# Patient Record
Sex: Female | Born: 1935
Health system: Southern US, Community
[De-identification: ages and names within clinical notes are randomized; demographics above are authoritative.]

## PROBLEM LIST (undated history)

## (undated) DIAGNOSIS — K219 Gastro-esophageal reflux disease without esophagitis: Secondary | ICD-10-CM

## (undated) DIAGNOSIS — M1990 Unspecified osteoarthritis, unspecified site: Secondary | ICD-10-CM

## (undated) DIAGNOSIS — E538 Deficiency of other specified B group vitamins: Secondary | ICD-10-CM

## (undated) DIAGNOSIS — R7303 Prediabetes: Secondary | ICD-10-CM

## (undated) DIAGNOSIS — G459 Transient cerebral ischemic attack, unspecified: Secondary | ICD-10-CM

## (undated) DIAGNOSIS — C801 Malignant (primary) neoplasm, unspecified: Secondary | ICD-10-CM

## (undated) DIAGNOSIS — K5790 Diverticulosis of intestine, part unspecified, without perforation or abscess without bleeding: Secondary | ICD-10-CM

## (undated) DIAGNOSIS — E782 Mixed hyperlipidemia: Secondary | ICD-10-CM

## (undated) DIAGNOSIS — E119 Type 2 diabetes mellitus without complications: Secondary | ICD-10-CM

## (undated) HISTORY — PX: VULVECTOMY: SHX1086

## (undated) HISTORY — DX: Deficiency of other specified B group vitamins: E53.8

## (undated) HISTORY — DX: Mixed hyperlipidemia: E78.2

## (undated) HISTORY — PX: ABDOMINAL HYSTERECTOMY: SHX81

## (undated) HISTORY — DX: Prediabetes: R73.03

## (undated) HISTORY — DX: Transient cerebral ischemic attack, unspecified: G45.9

---

## 2014-10-06 ENCOUNTER — Emergency Department (HOSPITAL_COMMUNITY)
Admission: EM | Admit: 2014-10-06 | Discharge: 2014-10-06 | Disposition: A | Discharge status: Home / Self Care | Payer: Medicare Other | Attending: Emergency Medicine | Admitting: Emergency Medicine

## 2014-10-06 ENCOUNTER — Encounter (HOSPITAL_COMMUNITY): Payer: Self-pay | Admitting: Emergency Medicine

## 2014-10-06 DIAGNOSIS — Z880 Allergy status to penicillin: Secondary | ICD-10-CM | POA: Diagnosis not present

## 2014-10-06 DIAGNOSIS — H6592 Unspecified nonsuppurative otitis media, left ear: Secondary | ICD-10-CM | POA: Diagnosis not present

## 2014-10-06 DIAGNOSIS — H9202 Otalgia, left ear: Secondary | ICD-10-CM | POA: Diagnosis present

## 2014-10-06 HISTORY — DX: Unspecified osteoarthritis, unspecified site: M19.90

## 2014-10-06 MED ORDER — AZITHROMYCIN 250 MG PO TABS: 250.0000 mg | ORAL_TABLET | Freq: Every day | ORAL | Status: DC

## 2014-10-06 NOTE — Discharge Instructions (Signed)
Draining Ear Ear wax, pus, blood and other fluids are examples of the different types of drainage from ears. Drops or cream may be needed to lessen the itching which may occur with ear drainage. CAUSES   Skin irritations in the ear.  Ear infection.  Swimmer's ear.  Ruptured eardrum.  Foreign object in the ear canal.  Sudden pressure changes.  Head injury. HOME CARE INSTRUCTIONS   Only take over-the-counter or prescription medicines for pain, fever, or discomfort as directed by your caregiver.  Do not rub the ear canal with cotton-tipped swabs.  Do not swim until your caregiver says it is okay.  Before you take a shower, cover a cotton ball with petroleum jelly to keep water out.  Limit exposure to smoke. Secondhand smoke can increase the chance for ear infections.  Keep up with immunizations.  Wash your hands well.  Keep all follow-up appointments to examine the ear and evaluate hearing. SEEK MEDICAL CARE IF:   You have increased drainage.  You have ear pain, a fever, or drainage that is not getting better after 48 hours of antibiotics.  You are unusually tired. SEEK IMMEDIATE MEDICAL CARE IF:  You have severe ear pain or headache.  The patient is older than 3 months with a rectal or oral temperature of 102 F (38.9 C) or higher.  The patient is 39 months old or younger with a rectal temperature of 100.4 F (38 C) or higher.  You vomit.  You feel dizzy.  You have a seizure.  You have new hearing loss. MAKE SURE YOU:   Understand these instructions.  Will watch your condition.  Will get help right away if you are not doing well or get worse. Document Released: 12/11/2005 Document Revised: 03/04/2012 Document Reviewed: 10/14/2009 [facility] Patient Information 2015 [place], [place]. This information is not intended to replace advice given to you by your health care provider. Make sure you discuss any questions you have with your health care  provider.  Otitis Media Otitis media is redness, soreness, and inflammation of the middle ear. Otitis media may be caused by allergies or, most commonly, by infection. Often it occurs as a complication of the common cold. SIGNS AND SYMPTOMS Symptoms of otitis media may include:  Earache.  Fever.  Ringing in your ear.  Headache.  Leakage of fluid from the ear. DIAGNOSIS To diagnose otitis media, your health care provider will examine your ear with an otoscope. This is an instrument that allows your health care provider to see into your ear in order to examine your eardrum. Your health care provider also will ask you questions about your symptoms. TREATMENT  Typically, otitis media resolves on its own within 3-5 days. Your health care provider may prescribe medicine to ease your symptoms of pain. If otitis media does not resolve within 5 days or is recurrent, your health care provider may prescribe antibiotic medicines if he or she suspects that a bacterial infection is the cause. HOME CARE INSTRUCTIONS   If you were prescribed an antibiotic medicine, finish it all even if you start to feel better.  Take medicines only as directed by your health care provider.  Keep all follow-up visits as directed by your health care provider. SEEK MEDICAL CARE IF:  You have otitis media only in one ear, or bleeding from your nose, or both.  You notice a lump on your neck.  You are not getting better in 3-5 days.  You feel worse instead of better. SEEK IMMEDIATE  MEDICAL CARE IF:   You have pain that is not controlled with medicine.  You have swelling, redness, or pain around your ear or stiffness in your neck.  You notice that part of your face is paralyzed.  You notice that the bone behind your ear (mastoid) is tender when you touch it. MAKE SURE YOU:   Understand these instructions.  Will watch your condition.  Will get help right away if you are not doing well or get  worse. Document Released: 09/15/2004 Document Revised: 04/27/2014 Document Reviewed: 07/08/2013 [facility] Patient Information 2015 [place], [place]. This information is not intended to replace advice given to you by your health care provider. Make sure you discuss any questions you have with your health care provider.

## 2014-10-06 NOTE — ED Notes (Signed)
MD at bedside. 

## 2014-10-06 NOTE — ED Provider Notes (Signed)
This chart was scribed for Lemay, DO by Edison Simon, ED Scribe. This patient was seen in room APA04/APA04.  TIME SEEN: [phone]  CHIEF COMPLAINT: left ear pain  HPI: Rhemi Balbach is a 78 y.o. female with no significant past medical history who presents to the Emergency Department complaining of left ear pain with onset 1 month ago. She reports she had pain in her right ear that is mostly resolved at this time. She reports associated clear drainage. She describes her pain as soreness. She reports associated tinnitus to her right ear. No hearing loss She states she has not seen her PCP. She states she has been trying to clean her ears with Q-tips. She denies fever, cough, vomiting or diarrhea, headache. No vertigo.  ROS: See HPI Constitutional: no fever  Eyes: no drainage  ENT: no runny nose, ear pain, ear drainage, tinnitus Cardiovascular:  no chest pain  Resp: no SOB  GI: no vomiting GU: no dysuria Integumentary: no rash  Allergy: no hives  Musculoskeletal: no leg swelling  Neurological: no slurred speech ROS otherwise negative  PAST MEDICAL HISTORY/PAST SURGICAL HISTORY:  No past medical history on file.  MEDICATIONS:  Prior to Admission medications   Not on File    ALLERGIES:  Allergies not on file  SOCIAL HISTORY:  History  Substance Use Topics  . Smoking status: Not on file  . Smokeless tobacco: Not on file  . Alcohol Use: Not on file    FAMILY HISTORY: No family history on file.  EXAM: BP 156/98  Temp(Src) 98.1 F (36.7 C) (Oral)  Resp 18  Ht 5\' 8"  (1.727 m)  Wt 200 lb (90.719 kg)  BMI 30.42 kg/m2  SpO2 97% CONSTITUTIONAL: Alert and oriented and responds appropriately to questions. Well-appearing; well-nourished HEAD: Normocephalic EYES: Conjunctivae clear, PERRL ENT: Normal nose; no rhinorrhea; moist mucous membranes; pharynx without lesions noted; left TM erythematous and bulging with some clear fluid in the external auditory canal, no perforation  of the TM, no purulence, no tenderness or erhythema over mastoid process; small amount of clear drainage to posterior oropharynx with associated cobblestoning; no tonsillar hypertrophy or exudate, no muffled voice, no uvular deviation NECK: Supple, no meningismus, no LAD  CARD: RRR; S1 and S2 appreciated; no murmurs, no clicks, no rubs, no gallops RESP: Normal chest excursion without splinting or tachypnea; breath sounds clear and equal bilaterally; no wheezes, no rhonchi, no rales,  ABD/GI: Normal bowel sounds; non-distended; soft, non-tender, no rebound, no guarding BACK:  The back appears normal and is non-tender to palpation, there is no CVA tenderness EXT: Normal ROM in all joints; non-tender to palpation; no edema; normal capillary refill; no cyanosis    SKIN: Normal color for age and race; warm NEURO: Moves all extremities equally PSYCH: The patient's mood and manner are appropriate. Grooming and personal hygiene are appropriate.  MEDICAL DECISION MAKING: Patient here with otitis media. We'll discharge home with prescription for azithromycin as she is allergic to penicillins. Have discussed with her not to put anything into her external auditory canal. Have start to use over-the-counter drops for ear drainage if this continues. We'll have her use over-the-counter antihistamines. Have advised her to followup with her primary care physician if symptoms continue. She is otherwise well-appearing, nontoxic and in no distress. Patient given return precautions. She verbalizes understanding and is comfortable with plan.  I personally performed the services described in this documentation, which was scribed in my presence. The recorded information has been reviewed and is  accurate.    [place], DO 10/06/14 1642

## 2014-10-06 NOTE — ED Notes (Signed)
Having drainage from left ear and small amount from right.

## 2015-04-01 ENCOUNTER — Encounter: Payer: Self-pay | Admitting: Radiation Oncology

## 2015-04-05 ENCOUNTER — Encounter (HOSPITAL_COMMUNITY): Payer: Self-pay | Admitting: Emergency Medicine

## 2015-04-05 ENCOUNTER — Emergency Department (HOSPITAL_COMMUNITY): Payer: Medicare Other

## 2015-04-05 ENCOUNTER — Emergency Department (HOSPITAL_COMMUNITY)
Admission: EM | Admit: 2015-04-05 | Discharge: 2015-04-05 | Disposition: A | Discharge status: Home / Self Care | Payer: Medicare Other | Attending: Emergency Medicine | Admitting: Emergency Medicine

## 2015-04-05 DIAGNOSIS — Z79899 Other long term (current) drug therapy: Secondary | ICD-10-CM | POA: Insufficient documentation

## 2015-04-05 DIAGNOSIS — M542 Cervicalgia: Secondary | ICD-10-CM | POA: Insufficient documentation

## 2015-04-05 DIAGNOSIS — Z7982 Long term (current) use of aspirin: Secondary | ICD-10-CM | POA: Insufficient documentation

## 2015-04-05 DIAGNOSIS — R002 Palpitations: Secondary | ICD-10-CM | POA: Insufficient documentation

## 2015-04-05 DIAGNOSIS — M79602 Pain in left arm: Secondary | ICD-10-CM | POA: Diagnosis present

## 2015-04-05 DIAGNOSIS — M1812 Unilateral primary osteoarthritis of first carpometacarpal joint, left hand: Secondary | ICD-10-CM | POA: Diagnosis not present

## 2015-04-05 DIAGNOSIS — H6691 Otitis media, unspecified, right ear: Secondary | ICD-10-CM

## 2015-04-05 DIAGNOSIS — Z792 Long term (current) use of antibiotics: Secondary | ICD-10-CM | POA: Diagnosis not present

## 2015-04-05 DIAGNOSIS — H6123 Impacted cerumen, bilateral: Secondary | ICD-10-CM | POA: Insufficient documentation

## 2015-04-05 DIAGNOSIS — Z880 Allergy status to penicillin: Secondary | ICD-10-CM | POA: Insufficient documentation

## 2015-04-05 LAB — CBC WITH DIFFERENTIAL/PLATELET
BASOS ABS: 0.1 10*3/uL (ref 0.0–0.1)
BASOS PCT: 1 % (ref 0–1)
EOS PCT: 5 % (ref 0–5)
Eosinophils Absolute: 0.4 10*3/uL (ref 0.0–0.7)
HCT: 43.5 % (ref 36.0–46.0)
Hemoglobin: 14.3 g/dL (ref 12.0–15.0)
LYMPHS PCT: 25 % (ref 12–46)
Lymphs Abs: 2.2 10*3/uL (ref 0.7–4.0)
MCH: 31 pg (ref 26.0–34.0)
MCHC: 32.9 g/dL (ref 30.0–36.0)
MCV: 94.2 fL (ref 78.0–100.0)
Monocytes Absolute: 1 10*3/uL (ref 0.1–1.0)
Monocytes Relative: 11 % (ref 3–12)
Neutro Abs: 4.9 10*3/uL (ref 1.7–7.7)
Neutrophils Relative %: 57 % (ref 43–77)
PLATELETS: 327 10*3/uL (ref 150–400)
RBC: 4.62 MIL/uL (ref 3.87–5.11)
RDW: 12.8 % (ref 11.5–15.5)
WBC: 8.5 10*3/uL (ref 4.0–10.5)

## 2015-04-05 LAB — COMPREHENSIVE METABOLIC PANEL
ALK PHOS: 70 U/L (ref 39–117)
ALT: 13 U/L (ref 0–35)
AST: 19 U/L (ref 0–37)
Albumin: 3.8 g/dL (ref 3.5–5.2)
Anion gap: 9 (ref 5–15)
BUN: 13 mg/dL (ref 6–23)
CO2: 26 mmol/L (ref 19–32)
Calcium: 8.9 mg/dL (ref 8.4–10.5)
Chloride: 104 mmol/L (ref 96–112)
Creatinine, Ser: 0.9 mg/dL (ref 0.50–1.10)
GFR calc Af Amer: 70 mL/min — ABNORMAL LOW (ref >90)
GFR calc non Af Amer: 61 mL/min — ABNORMAL LOW (ref >90)
GLUCOSE: 127 mg/dL — AB (ref 70–99)
POTASSIUM: 4 mmol/L (ref 3.5–5.1)
SODIUM: 139 mmol/L (ref 135–145)
TOTAL PROTEIN: 7.6 g/dL (ref 6.0–8.3)
Total Bilirubin: 0.4 mg/dL (ref 0.3–1.2)

## 2015-04-05 LAB — TROPONIN I

## 2015-04-05 MED ORDER — DOCUSATE SODIUM 50 MG/5ML PO LIQD
10.0000 mg | Freq: Once | ORAL | Status: DC
  Filled 2015-04-05: qty 10

## 2015-04-05 MED ORDER — DOCUSATE SODIUM 50 MG/5ML PO LIQD
10.0000 mg | Freq: Once | ORAL | Status: AC
  Administered 2015-04-05: 10 mg via OTIC
  Filled 2015-04-05: qty 10

## 2015-04-05 MED ORDER — DOCUSATE SODIUM 50 MG/5ML PO LIQD
ORAL | Status: AC
  Filled 2015-04-05: qty 10

## 2015-04-05 MED ORDER — CEFDINIR 300 MG PO CAPS: 300.0000 mg | ORAL_CAPSULE | Freq: Two times a day (BID) | ORAL | Status: DC

## 2015-04-05 NOTE — ED Provider Notes (Signed)
CSN: [id number]     Arrival date & time 04/05/15  1908 History   First MD Initiated Contact with Patient 04/05/15 2047     Chief Complaint  Patient presents with  . Arm Pain     (Consider location/radiation/quality/duration/timing/severity/associated sxs/prior Treatment) Patient is a 79 y.o. female presenting with arm pain.  Arm Pain Associated symptoms include headaches. Pertinent negatives include no chest pain, no abdominal pain and no shortness of breath.   patient with various complaints. Complaining of pain in her left hand. States his been there for a while but worse now. No trauma. States it is more swollen. Also complaining her left shoulder going down the arm. Worse with movement. States that she also has had some feeling of heat in her chest when hand pain was bothering her. States she did feel her heart race a little bit. Just feels better now. Also states his in both her ears feel full. States she's having a little difficulty hearing. Complaining of a bump on the back of her head. No trauma. No fevers. No chills. No cough. No nausea vomiting.   Past Medical History  Diagnosis Date  . Arthritis    Past Surgical History  Procedure Laterality Date  . Abdominal hysterectomy     Family History  Problem Relation Age of Onset  . Heart failure Father   . Cancer Sister   . Cancer Other    History  Substance Use Topics  . Smoking status: Never Smoker   . Smokeless tobacco: Not on file  . Alcohol Use: No   OB History    No data available     Review of Systems  Constitutional: Negative for activity change and appetite change.  HENT: Positive for ear pain. Negative for ear discharge.   Eyes: Negative for pain.  Respiratory: Negative for chest tightness and shortness of breath.   Cardiovascular: Positive for palpitations. Negative for chest pain and leg swelling.  Gastrointestinal: Negative for nausea, vomiting, abdominal pain and diarrhea.  Genitourinary: Negative for  flank pain.  Musculoskeletal: Positive for neck pain. Negative for back pain and neck stiffness.  Skin: Negative for rash.  Neurological: Positive for headaches. Negative for weakness and numbness.  Psychiatric/Behavioral: Negative for behavioral problems.      Allergies  Lidocaine and Penicillins  Home Medications   Prior to Admission medications   Medication Sig Start Date End Date Taking? Authorizing Provider  acetaminophen (TYLENOL) 500 MG tablet Take 1,000 mg by mouth every 6 (six) hours as needed for mild pain.   Yes Historical Provider, MD  aspirin EC 81 MG tablet Take 81 mg by mouth daily.   Yes Historical Provider, MD  cetirizine (ZYRTEC) 10 MG tablet Take 10 mg by mouth every morning.   Yes Historical Provider, MD  diphenhydrAMINE (BENADRYL) 25 MG tablet 25 mg at bedtime.    Yes Historical Provider, MD  ibuprofen (ADVIL,MOTRIN) 200 MG tablet Take 200 mg by mouth every 6 (six) hours as needed for mild pain or moderate pain.   Yes Historical Provider, MD  Multiple Vitamin (MULTIVITAMIN WITH MINERALS) TABS tablet Take 1 tablet by mouth daily.   Yes Historical Provider, MD  azithromycin (ZITHROMAX) 250 MG tablet Take 1 tablet (250 mg total) by mouth daily. Take first 2 tablets together, then 1 every day until finished. Patient not taking: Reported on 04/05/2015 10/06/14   Delice Bison Ward, DO  cefdinir (OMNICEF) 300 MG capsule Take 1 capsule (300 mg total) by mouth 2 (two) times daily.  04/05/15   Davonna Belling, MD   BP 140/90 mmHg  Pulse 83  Temp(Src) 98.3 F (36.8 C) (Oral)  Resp 20  Ht 5\' 8"  (1.727 m)  Wt 200 lb (90.719 kg)  BMI 30.42 kg/m2  SpO2 96% Physical Exam  Constitutional: She appears well-developed and well-nourished.  HENT:  Head: Normocephalic and atraumatic.  Some cerumen in bilateral ears.  Eyes: Pupils are equal, round, and reactive to light.  Neck: Neck supple.  Cardiovascular: Normal rate and regular rhythm.   Pulmonary/Chest: Effort normal and breath  sounds normal.  Abdominal: Soft. Bowel sounds are normal. There is no tenderness.  Musculoskeletal: Normal range of motion.  Neurological: She is alert.  Skin: Skin is warm.    ED Course  Procedures (including critical care time) Labs Review Labs Reviewed  COMPREHENSIVE METABOLIC PANEL - Abnormal; Notable for the following:    Glucose, Bld 127 (*)    GFR calc non Af Amer 61 (*)    GFR calc Af Amer 70 (*)    All other components within normal limits  TROPONIN I  CBC WITH DIFFERENTIAL/PLATELET    Imaging Review Dg Chest 2 View  04/05/2015   CLINICAL DATA:  Left arm pain starting today. Neck pain. Stent neck. Hypertension. Sensation of heat in the left chest.  EXAM: CHEST  2 VIEW  COMPARISON:  None.  FINDINGS: Mildly tortuous thoracic aorta. Mild enlargement of the cardiopericardial silhouette.  No pleural effusion.  The lungs appear clear.  IMPRESSION: 1. Mild enlargement of the cardiopericardial silhouette, but the lungs appear clear, without evidence of edema.   Electronically Signed   By: Van Clines M.D.   On: 04/05/2015 20:47   Dg Finger Thumb Left  04/05/2015   CLINICAL DATA:  Swelling of the left thumb.  EXAM: LEFT THUMB 2+V  COMPARISON:  None.  FINDINGS: Degenerative loss of articular space at the interphalangeal joint and at the first carpometacarpal articulation, with associated spurring.  I do not observe a fracture.  IMPRESSION: 1. Osteoarthritis with loss of articular space and spurring at the first carpometacarpal joint and at the interphalangeal joint of the thumb.   Electronically Signed   By: Van Clines M.D.   On: 04/05/2015 21:46     EKG Interpretation   Date/Time:  Monday April 05 2015 19:39:48 EDT Ventricular Rate:  85 PR Interval:  170 QRS Duration: 72 QT Interval:  348 QTC Calculation: 414 R Axis:   4 Text Interpretation:  Normal sinus rhythm Low voltage QRS Cannot rule out  Anterior infarct , age undetermined Abnormal ECG Confirmed by  Alvino Chapel   MD, Ovid Curd [phone]) on 04/05/2015 9:18:17 PM      MDM   Final diagnoses:  Right otitis media, recurrence not specified, unspecified chronicity, unspecified otitis media type  Degenerative arthritis of thumb, left    Patient with several complaints. Does have apparent otitis media on the right. He is in the chest. Does have arthritis in her left thumb also. Will discharge with antibiotics.    Davonna Belling, MD 04/07/15 [phone]

## 2015-04-05 NOTE — ED Notes (Signed)
Patient also reports she feels that her left hand is swollen. States, "My thumb has been bothering me for a month or so."

## 2015-04-05 NOTE — ED Notes (Addendum)
Patient complaining of left arm pain that started today along with the feeling of fullness/stuffiness to both ears. Also complaining of neck pain, states neck pain has been occurring for months and she has been evaluated by EDP. Patient states, "My blood pressure was elevated and I feel like my heart rate has been fluctuating today." Patient also reports generalized weakness and fatigue that started today. Patient denies chest pain, but reports "hot" sensation to left chest.

## 2015-04-05 NOTE — Discharge Instructions (Signed)

## 2015-04-05 NOTE — ED Notes (Signed)
EKG given to Dr. Alvino Chapel. Patient's condition discussed with him and orders placed.

## 2016-02-04 DIAGNOSIS — H6123 Impacted cerumen, bilateral: Secondary | ICD-10-CM | POA: Diagnosis not present

## 2016-02-04 DIAGNOSIS — J329 Chronic sinusitis, unspecified: Secondary | ICD-10-CM | POA: Diagnosis not present

## 2016-03-25 ENCOUNTER — Emergency Department (HOSPITAL_COMMUNITY): Payer: Medicare Other

## 2016-03-25 ENCOUNTER — Emergency Department (HOSPITAL_COMMUNITY)
Admission: EM | Admit: 2016-03-25 | Discharge: 2016-03-25 | Disposition: A | Discharge status: Home / Self Care | Payer: Medicare Other | Attending: Emergency Medicine | Admitting: Emergency Medicine

## 2016-03-25 ENCOUNTER — Encounter (HOSPITAL_COMMUNITY): Payer: Self-pay | Admitting: *Deleted

## 2016-03-25 DIAGNOSIS — Z7982 Long term (current) use of aspirin: Secondary | ICD-10-CM | POA: Insufficient documentation

## 2016-03-25 DIAGNOSIS — Z79899 Other long term (current) drug therapy: Secondary | ICD-10-CM | POA: Diagnosis not present

## 2016-03-25 DIAGNOSIS — R0789 Other chest pain: Secondary | ICD-10-CM | POA: Insufficient documentation

## 2016-03-25 DIAGNOSIS — R0781 Pleurodynia: Secondary | ICD-10-CM | POA: Diagnosis not present

## 2016-03-25 DIAGNOSIS — M1990 Unspecified osteoarthritis, unspecified site: Secondary | ICD-10-CM | POA: Insufficient documentation

## 2016-03-25 DIAGNOSIS — E119 Type 2 diabetes mellitus without complications: Secondary | ICD-10-CM | POA: Diagnosis not present

## 2016-03-25 DIAGNOSIS — Z791 Long term (current) use of non-steroidal anti-inflammatories (NSAID): Secondary | ICD-10-CM | POA: Insufficient documentation

## 2016-03-25 DIAGNOSIS — M25511 Pain in right shoulder: Secondary | ICD-10-CM | POA: Diagnosis not present

## 2016-03-25 HISTORY — DX: Diverticulosis of intestine, part unspecified, without perforation or abscess without bleeding: K57.90

## 2016-03-25 HISTORY — DX: Gastro-esophageal reflux disease without esophagitis: K21.9

## 2016-03-25 HISTORY — DX: Type 2 diabetes mellitus without complications: E11.9

## 2016-03-25 LAB — HEPATIC FUNCTION PANEL
ALT: 14 U/L (ref 14–54)
AST: 20 U/L (ref 15–41)
Albumin: 3.8 g/dL (ref 3.5–5.0)
Alkaline Phosphatase: 65 U/L (ref 38–126)
Bilirubin, Direct: 0.1 mg/dL (ref 0.1–0.5)
Indirect Bilirubin: 0.5 mg/dL (ref 0.3–0.9)
TOTAL PROTEIN: 7.7 g/dL (ref 6.5–8.1)
Total Bilirubin: 0.6 mg/dL (ref 0.3–1.2)

## 2016-03-25 LAB — LIPASE, BLOOD: LIPASE: 32 U/L (ref 11–51)

## 2016-03-25 LAB — CBC
HCT: 42.8 % (ref 36.0–46.0)
HEMOGLOBIN: 14.1 g/dL (ref 12.0–15.0)
MCH: 30.3 pg (ref 26.0–34.0)
MCHC: 32.9 g/dL (ref 30.0–36.0)
MCV: 92 fL (ref 78.0–100.0)
PLATELETS: 330 10*3/uL (ref 150–400)
RBC: 4.65 MIL/uL (ref 3.87–5.11)
RDW: 13 % (ref 11.5–15.5)
WBC: 7.4 10*3/uL (ref 4.0–10.5)

## 2016-03-25 LAB — BASIC METABOLIC PANEL
ANION GAP: 8 (ref 5–15)
BUN: 13 mg/dL (ref 6–20)
CALCIUM: 8.9 mg/dL (ref 8.9–10.3)
CHLORIDE: 105 mmol/L (ref 101–111)
CO2: 25 mmol/L (ref 22–32)
CREATININE: 0.78 mg/dL (ref 0.44–1.00)
GFR calc Af Amer: >60 mL/min (ref >60)
GFR calc non Af Amer: >60 mL/min (ref >60)
Glucose, Bld: 100 mg/dL — ABNORMAL HIGH (ref 65–99)
Potassium: 4.2 mmol/L (ref 3.5–5.1)
SODIUM: 138 mmol/L (ref 135–145)

## 2016-03-25 LAB — URINE MICROSCOPIC-ADD ON

## 2016-03-25 LAB — URINALYSIS, ROUTINE W REFLEX MICROSCOPIC
Bilirubin Urine: NEGATIVE
GLUCOSE, UA: NEGATIVE mg/dL
Ketones, ur: NEGATIVE mg/dL
LEUKOCYTES UA: NEGATIVE
NITRITE: NEGATIVE
PH: 5 (ref 5.0–8.0)
PROTEIN: NEGATIVE mg/dL
Specific Gravity, Urine: 1.01 (ref 1.005–1.030)

## 2016-03-25 LAB — CBG MONITORING, ED: GLUCOSE-CAPILLARY: 104 mg/dL — AB (ref 65–99)

## 2016-03-25 LAB — TROPONIN I: Troponin I: <0.03 ng/mL (ref <0.031)

## 2016-03-25 LAB — RAPID STREP SCREEN (MED CTR MEBANE ONLY): STREPTOCOCCUS, GROUP A SCREEN (DIRECT): NEGATIVE

## 2016-03-25 LAB — D-DIMER, QUANTITATIVE: D-Dimer, Quant: 0.46 ug/mL-FEU (ref 0.00–0.50)

## 2016-03-25 NOTE — ED Notes (Signed)
MD at bedside. 

## 2016-03-25 NOTE — ED Provider Notes (Signed)
CSN: [id number]     Arrival date & time 03/25/16  1012 History  By signing my name below, I, [facility], attest that this documentation has been prepared under the direction and in the presence of Ezequiel Essex, MD. Electronically Signed: Virgel Bouquet, ED Scribe. 03/25/2016. 2:46 PM.   Chief Complaint  Patient presents with  . Chest Pain   The history is provided by the patient. No language interpreter was used.   HPI Comments: Debra Olsen is a 80 y.o. female who presents to the Emergency Department complaining of constant, right-sided CP onset 3 days ago, worse with deep inspiration. She describes the pain as pressure and soreness. Patient reports that she woke from sleep last night with a dry mouth and feeling "jittery." She states that she ate a banana and drank water with slight improvement but that jitteriness was still present upon waking this morning. She endorses right shoulder pain ongoing for the past month after hanging blinds, polydipsia, and polyuria. Denies new medications or recent medication changes. She states that she took Benadryl last night. She notes similar jittery feeling in the past that she associates with prediabetes but notes that this feeling normally presents with dizziness  (absent for current episode). Denies hx of pulmonary conditions, CAD, and cholecystectomy. Per patient, she is prediabetic per her PCP Dr. Edrick Oh. Denies fevers, nausea, vomiting, abdominal pain, dizziness, and lightheadedness.  Past Medical History  Diagnosis Date  . Arthritis   . Diabetes mellitus without complication ([place])   . GERD (gastroesophageal reflux disease)   . Diverticulosis of intestine    Past Surgical History  Procedure Laterality Date  . Abdominal hysterectomy     Family History  Problem Relation Age of Onset  . Heart failure Father   . Cancer Sister   . Cancer Other    Social History  Substance Use Topics  . Smoking status: Never Smoker   . Smokeless  tobacco: None  . Alcohol Use: No   OB History    No data available     Review of Systems A complete 10 system review of systems was obtained and all systems are negative except as noted in the HPI and PMH.    Allergies  Lidocaine and Penicillins  Home Medications   Prior to Admission medications   Medication Sig Start Date End Date Taking? Authorizing Provider  acetaminophen (TYLENOL) 500 MG tablet Take 1,000 mg by mouth every 6 (six) hours as needed for mild pain.   Yes Historical Provider, MD  aspirin EC 81 MG tablet Take 81 mg by mouth daily.   Yes Historical Provider, MD  diphenhydrAMINE (BENADRYL) 25 MG tablet 25 mg at bedtime.    Yes Historical Provider, MD  ibuprofen (ADVIL,MOTRIN) 200 MG tablet Take 200 mg by mouth every 6 (six) hours as needed for mild pain or moderate pain.   Yes Historical Provider, MD   BP 118/56 mmHg  Pulse 69  Temp(Src) 98.3 F (36.8 C) (Oral)  Resp 16  Ht 5\' 8"  (1.727 m)  Wt 205 lb (92.987 kg)  BMI 31.18 kg/m2  SpO2 98% Physical Exam  Constitutional: She is oriented to person, place, and time. She appears well-developed and well-nourished. No distress.  Well appearing.  HENT:  Head: Normocephalic and atraumatic.  Mouth/Throat: Oropharynx is clear and moist and mucous membranes are normal. No oropharyngeal exudate.  Eyes: Conjunctivae and EOM are normal. Pupils are equal, round, and reactive to light.  Neck: Normal range of motion. Neck supple.  No  meningismus.  Cardiovascular: Normal rate, regular rhythm, normal heart sounds and intact distal pulses.   No murmur heard. Pulmonary/Chest: Effort normal and breath sounds normal. No respiratory distress.  Discomfort with palpation of right chest.  Abdominal: Soft. There is no tenderness. There is no rebound and no guarding.  No RUQ tenderness.  Musculoskeletal: Normal range of motion. She exhibits no edema or tenderness.  FROM R shoulder without pain. Able to raise above head and touch  opposite shoulder. Intact radial pulse.   Neurological: She is alert and oriented to person, place, and time. No cranial nerve deficit. She exhibits normal muscle tone. Coordination normal.  No ataxia on finger to nose bilaterally. No pronator drift. 5/5 strength throughout. CN 2-12 intact.Equal grip strength. Sensation intact.  No visible shaking or tremors.  Skin: Skin is warm.  Psychiatric: She has a normal mood and affect. Her behavior is normal.  Nursing note and vitals reviewed.   ED Course  Procedures   DIAGNOSTIC STUDIES: Oxygen Saturation is 97% on RA, normal by my interpretation.    COORDINATION OF CARE: 10:36 AM Will order labs and x-rays of right shoulder and chest and labs. Discussed treatment plan with pt at bedside and pt agreed to plan.  12:20 PM Returned to discuss results of labs and imaging.  Labs Review Labs Reviewed  BASIC METABOLIC PANEL - Abnormal; Notable for the following:    Glucose, Bld 100 (*)    All other components within normal limits  URINALYSIS, ROUTINE W REFLEX MICROSCOPIC (NOT AT [facility]) - Abnormal; Notable for the following:    Hgb urine dipstick TRACE (*)    All other components within normal limits  URINE MICROSCOPIC-ADD ON - Abnormal; Notable for the following:    Squamous Epithelial / LPF 0-5 (*)    Bacteria, UA RARE (*)    All other components within normal limits  CBG MONITORING, ED - Abnormal; Notable for the following:    Glucose-Capillary 104 (*)    All other components within normal limits  RAPID STREP SCREEN (NOT AT [facility])  CULTURE, GROUP A STREP ([place])  CBC  TROPONIN I  LIPASE, BLOOD  D-DIMER, QUANTITATIVE (NOT AT [facility])  HEPATIC FUNCTION PANEL  TROPONIN I    Imaging Review Dg Chest 2 View  03/25/2016  CLINICAL DATA:  Right-sided pleuritic chest pain for approximately 1 month. No known injury. EXAM: CHEST  2 VIEW COMPARISON:  04/05/2015 FINDINGS: The heart size and mediastinal contours are within normal limits. Both lungs are  clear. No evidence of pneumothorax or pleural effusion. The visualized skeletal structures are unremarkable. IMPRESSION: No active cardiopulmonary disease. Electronically Signed   By: Earle Gell M.D.   On: 03/25/2016 11:47   Dg Shoulder Right  03/25/2016  CLINICAL DATA:  Right shoulder injury approximately 1 month ago. Persistent right shoulder pain. Initial encounter. EXAM: RIGHT SHOULDER - 2+ VIEW COMPARISON:  None. FINDINGS: There is no evidence of fracture or dislocation. There is no evidence of arthropathy or other focal bone abnormality. Soft tissues are unremarkable. IMPRESSION: Negative. Electronically Signed   By: Earle Gell M.D.   On: 03/25/2016 11:45   I have personally reviewed and evaluated these images and lab results as part of my medical decision-making.   EKG Interpretation   Date/Time:  Saturday March 25 2016 10:29:50 EDT Ventricular Rate:  83 PR Interval:  171 QRS Duration: 90 QT Interval:  391 QTC Calculation: 459 R Axis:   3 Text Interpretation:  Sinus rhythm Low voltage, precordial leads  Borderline T abnormalities, anterior leads No significant change was found  Confirmed by Wyvonnia Dusky  MD, Sibbie Flammia [phone]) on 03/25/2016 11:54:23 AM      MDM   Final diagnoses:  Atypical chest pain   Patient reports "jitteriness" since last night. She is worried about her blood sugar which is normal at 104. She also has had right-sided chest discomfort constant for the past 3 days but worse with palpation and deep breathing.  EKG nsr and unchanged.  No RUQ tenderness. Troponin negative, D-dimer negative.  Labs otherwise unremarkable.   Patient states "jitters" have improved. She has negative orthostatics. She is starting by mouth. Troponin is negative 2. D-dimer is negative. Chest x-rays negative. Low suspicion for ACS or pulmonary embolism. No right upper quadrant pain to suggest gallbladder disease. LFTs and lipase normal. No fever or leukocytosis.  Jitters may be due to  medication use, possibly benadryl.  Advised to avoid this and follow up with PCP. Return precautions discussed.  I personally performed the services described in this documentation, which was scribed in my presence. The recorded information has been reviewed and is accurate.   Ezequiel Essex, MD 03/25/16 [phone]

## 2016-03-25 NOTE — ED Notes (Signed)
Pt comes in for a "jittery feeling", pt CBG 104 upon triage. Pt states she has had right chest pain for 3 days. Pt denies any shortness of breath.

## 2016-03-25 NOTE — ED Notes (Signed)
C/o right shoulder pain since putting up blinds about one month ago.  C/o popping to right shoulder when raising arm.  C/o soreness when taking chest when taking in deep breathe.

## 2016-03-25 NOTE — Discharge Instructions (Signed)
Nonspecific Chest Pain  There is no evidence of heart attack or blood clot in the lung. Try to avoid taking Benadryl. This may be causing her symptoms. Follow up with your doctor this week. Return to ED if you develop new or worsening symptoms. Chest pain can be caused by many different conditions. There is always a chance that your pain could be related to something serious, such as a heart attack or a blood clot in your lungs. Chest pain can also be caused by conditions that are not life-threatening. If you have chest pain, it is very important to follow up with your health care provider. CAUSES  Chest pain can be caused by:  Heartburn.  Pneumonia or bronchitis.  Anxiety or stress.  Inflammation around your heart (pericarditis) or lung (pleuritis or pleurisy).  A blood clot in your lung.  A collapsed lung (pneumothorax). It can develop suddenly on its own (spontaneous pneumothorax) or from trauma to the chest.  Shingles infection (varicella-zoster virus).  Heart attack.  Damage to the bones, muscles, and cartilage that make up your chest wall. This can include:  Bruised bones due to injury.  Strained muscles or cartilage due to frequent or repeated coughing or overwork.  Fracture to one or more ribs.  Sore cartilage due to inflammation (costochondritis). RISK FACTORS  Risk factors for chest pain may include:  Activities that increase your risk for trauma or injury to your chest.  Respiratory infections or conditions that cause frequent coughing.  Medical conditions or overeating that can cause heartburn.  Heart disease or family history of heart disease.  Conditions or health behaviors that increase your risk of developing a blood clot.  Having had chicken pox (varicella zoster). SIGNS AND SYMPTOMS Chest pain can feel like:  Burning or tingling on the surface of your chest or deep in your chest.  Crushing, pressure, aching, or squeezing pain.  Dull or sharp pain  that is worse when you move, cough, or take a deep breath.  Pain that is also felt in your back, neck, shoulder, or arm, or pain that spreads to any of these areas. Your chest pain may come and go, or it may stay constant. DIAGNOSIS Lab tests or other studies may be needed to find the cause of your pain. Your health care provider may have you take a test called an ambulatory ECG (electrocardiogram). An ECG records your heartbeat patterns at the time the test is performed. You may also have other tests, such as:  Transthoracic echocardiogram (TTE). During echocardiography, sound waves are used to create a picture of all of the heart structures and to look at how blood flows through your heart.  Transesophageal echocardiogram (TEE).This is a more advanced imaging test that obtains images from inside your body. It allows your health care provider to see your heart in finer detail.  Cardiac monitoring. This allows your health care provider to monitor your heart rate and rhythm in real time.  Holter monitor. This is a portable device that records your heartbeat and can help to diagnose abnormal heartbeats. It allows your health care provider to track your heart activity for several days, if needed.  Stress tests. These can be done through exercise or by taking medicine that makes your heart beat more quickly.  Blood tests.  Imaging tests. TREATMENT  Your treatment depends on what is causing your chest pain. Treatment may include:  Medicines. These may include:  Acid blockers for heartburn.  Anti-inflammatory medicine.  Pain medicine for  inflammatory conditions.  Antibiotic medicine, if an infection is present.  Medicines to dissolve blood clots.  Medicines to treat coronary artery disease.  Supportive care for conditions that do not require medicines. This may include:  Resting.  Applying heat or cold packs to injured areas.  Limiting activities until pain decreases. HOME CARE  INSTRUCTIONS  If you were prescribed an antibiotic medicine, finish it all even if you start to feel better.  Avoid any activities that bring on chest pain.  Do not use any tobacco products, including cigarettes, chewing tobacco, or electronic cigarettes. If you need help quitting, ask your health care provider.  Do not drink alcohol.  Take medicines only as directed by your health care provider.  Keep all follow-up visits as directed by your health care provider. This is important. This includes any further testing if your chest pain does not go away.  If heartburn is the cause for your chest pain, you may be told to keep your head raised (elevated) while sleeping. This reduces the chance that acid will go from your stomach into your esophagus.  Make lifestyle changes as directed by your health care provider. These may include:  Getting regular exercise. Ask your health care provider to suggest some activities that are safe for you.  Eating a heart-healthy diet. A registered dietitian can help you to learn healthy eating options.  Maintaining a healthy weight.  Managing diabetes, if necessary.  Reducing stress. SEEK MEDICAL CARE IF:  Your chest pain does not go away after treatment.  You have a rash with blisters on your chest.  You have a fever. SEEK IMMEDIATE MEDICAL CARE IF:   Your chest pain is worse.  You have an increasing cough, or you cough up blood.  You have severe abdominal pain.  You have severe weakness.  You faint.  You have chills.  You have sudden, unexplained chest discomfort.  You have sudden, unexplained discomfort in your arms, back, neck, or jaw.  You have shortness of breath at any time.  You suddenly start to sweat, or your skin gets clammy.  You feel nauseous or you vomit.  You suddenly feel light-headed or dizzy.  Your heart begins to beat quickly, or it feels like it is skipping beats. These symptoms may represent a serious  problem that is an emergency. Do not wait to see if the symptoms will go away. Get medical help right away. Call your local emergency services (911 in the [place] Do not drive yourself to the hospital.   This information is not intended to replace advice given to you by your health care provider. Make sure you discuss any questions you have with your health care provider.   Document Released: 09/20/2005 Document Revised: 01/01/2015 Document Reviewed: 07/17/2014 Elsevier Interactive Patient Education [employer].

## 2016-03-28 LAB — CULTURE, GROUP A STREP (THRC)

## 2016-08-01 DIAGNOSIS — H524 Presbyopia: Secondary | ICD-10-CM | POA: Diagnosis not present

## 2016-08-01 DIAGNOSIS — H40053 Ocular hypertension, bilateral: Secondary | ICD-10-CM | POA: Diagnosis not present

## 2016-08-17 DIAGNOSIS — H2512 Age-related nuclear cataract, left eye: Secondary | ICD-10-CM | POA: Diagnosis not present

## 2016-08-17 DIAGNOSIS — H25012 Cortical age-related cataract, left eye: Secondary | ICD-10-CM | POA: Diagnosis not present

## 2016-08-17 DIAGNOSIS — H2511 Age-related nuclear cataract, right eye: Secondary | ICD-10-CM | POA: Diagnosis not present

## 2016-08-17 DIAGNOSIS — H40013 Open angle with borderline findings, low risk, bilateral: Secondary | ICD-10-CM | POA: Diagnosis not present

## 2016-08-17 DIAGNOSIS — H25011 Cortical age-related cataract, right eye: Secondary | ICD-10-CM | POA: Diagnosis not present

## 2016-08-17 DIAGNOSIS — H35031 Hypertensive retinopathy, right eye: Secondary | ICD-10-CM | POA: Diagnosis not present

## 2016-08-17 DIAGNOSIS — H35032 Hypertensive retinopathy, left eye: Secondary | ICD-10-CM | POA: Diagnosis not present

## 2016-09-19 DIAGNOSIS — H2512 Age-related nuclear cataract, left eye: Secondary | ICD-10-CM | POA: Diagnosis not present

## 2016-09-19 DIAGNOSIS — H25012 Cortical age-related cataract, left eye: Secondary | ICD-10-CM | POA: Diagnosis not present

## 2016-09-19 DIAGNOSIS — H25812 Combined forms of age-related cataract, left eye: Secondary | ICD-10-CM | POA: Diagnosis not present

## 2016-10-02 DIAGNOSIS — H25041 Posterior subcapsular polar age-related cataract, right eye: Secondary | ICD-10-CM | POA: Diagnosis not present

## 2016-10-02 DIAGNOSIS — H25011 Cortical age-related cataract, right eye: Secondary | ICD-10-CM | POA: Diagnosis not present

## 2016-10-02 DIAGNOSIS — H2511 Age-related nuclear cataract, right eye: Secondary | ICD-10-CM | POA: Diagnosis not present

## 2016-10-10 DIAGNOSIS — H2511 Age-related nuclear cataract, right eye: Secondary | ICD-10-CM | POA: Diagnosis not present

## 2016-10-10 DIAGNOSIS — H25811 Combined forms of age-related cataract, right eye: Secondary | ICD-10-CM | POA: Diagnosis not present

## 2016-10-10 DIAGNOSIS — H25011 Cortical age-related cataract, right eye: Secondary | ICD-10-CM | POA: Diagnosis not present

## 2017-02-16 DIAGNOSIS — Z6831 Body mass index (BMI) 31.0-31.9, adult: Secondary | ICD-10-CM | POA: Diagnosis not present

## 2017-02-16 DIAGNOSIS — Z87891 Personal history of nicotine dependence: Secondary | ICD-10-CM | POA: Diagnosis not present

## 2017-02-16 DIAGNOSIS — R0789 Other chest pain: Secondary | ICD-10-CM | POA: Diagnosis not present

## 2017-02-16 DIAGNOSIS — Z713 Dietary counseling and surveillance: Secondary | ICD-10-CM | POA: Diagnosis not present

## 2017-02-16 DIAGNOSIS — L509 Urticaria, unspecified: Secondary | ICD-10-CM | POA: Diagnosis not present

## 2017-02-16 DIAGNOSIS — M159 Polyosteoarthritis, unspecified: Secondary | ICD-10-CM | POA: Diagnosis not present

## 2017-02-16 DIAGNOSIS — Z299 Encounter for prophylactic measures, unspecified: Secondary | ICD-10-CM | POA: Diagnosis not present

## 2017-02-16 DIAGNOSIS — K219 Gastro-esophageal reflux disease without esophagitis: Secondary | ICD-10-CM | POA: Diagnosis not present

## 2017-04-20 DIAGNOSIS — K219 Gastro-esophageal reflux disease without esophagitis: Secondary | ICD-10-CM | POA: Diagnosis not present

## 2017-04-20 DIAGNOSIS — E8940 Asymptomatic postprocedural ovarian failure: Secondary | ICD-10-CM | POA: Diagnosis not present

## 2017-04-20 DIAGNOSIS — Z7189 Other specified counseling: Secondary | ICD-10-CM | POA: Diagnosis not present

## 2017-04-20 DIAGNOSIS — Z6830 Body mass index (BMI) 30.0-30.9, adult: Secondary | ICD-10-CM | POA: Diagnosis not present

## 2017-04-20 DIAGNOSIS — Z299 Encounter for prophylactic measures, unspecified: Secondary | ICD-10-CM | POA: Diagnosis not present

## 2017-04-20 DIAGNOSIS — B354 Tinea corporis: Secondary | ICD-10-CM | POA: Diagnosis not present

## 2017-04-20 DIAGNOSIS — E7800 Pure hypercholesterolemia, unspecified: Secondary | ICD-10-CM | POA: Diagnosis not present

## 2017-04-20 DIAGNOSIS — Z79899 Other long term (current) drug therapy: Secondary | ICD-10-CM | POA: Diagnosis not present

## 2017-04-20 DIAGNOSIS — Z0000 Encounter for general adult medical examination without abnormal findings: Secondary | ICD-10-CM | POA: Diagnosis not present

## 2017-04-20 DIAGNOSIS — Z1389 Encounter for screening for other disorder: Secondary | ICD-10-CM | POA: Diagnosis not present

## 2017-04-20 DIAGNOSIS — Z1211 Encounter for screening for malignant neoplasm of colon: Secondary | ICD-10-CM | POA: Diagnosis not present

## 2017-04-20 DIAGNOSIS — R5383 Other fatigue: Secondary | ICD-10-CM | POA: Diagnosis not present

## 2017-04-23 DIAGNOSIS — Z1231 Encounter for screening mammogram for malignant neoplasm of breast: Secondary | ICD-10-CM | POA: Diagnosis not present

## 2017-04-24 DIAGNOSIS — R5383 Other fatigue: Secondary | ICD-10-CM | POA: Diagnosis not present

## 2017-04-24 DIAGNOSIS — Z79899 Other long term (current) drug therapy: Secondary | ICD-10-CM | POA: Diagnosis not present

## 2017-04-24 DIAGNOSIS — E7800 Pure hypercholesterolemia, unspecified: Secondary | ICD-10-CM | POA: Diagnosis not present

## 2017-05-10 DIAGNOSIS — E2839 Other primary ovarian failure: Secondary | ICD-10-CM | POA: Diagnosis not present

## 2017-08-20 DIAGNOSIS — E7800 Pure hypercholesterolemia, unspecified: Secondary | ICD-10-CM | POA: Diagnosis not present

## 2017-08-20 DIAGNOSIS — Z6830 Body mass index (BMI) 30.0-30.9, adult: Secondary | ICD-10-CM | POA: Diagnosis not present

## 2017-08-20 DIAGNOSIS — Z713 Dietary counseling and surveillance: Secondary | ICD-10-CM | POA: Diagnosis not present

## 2017-08-20 DIAGNOSIS — K219 Gastro-esophageal reflux disease without esophagitis: Secondary | ICD-10-CM | POA: Diagnosis not present

## 2017-08-20 DIAGNOSIS — Z299 Encounter for prophylactic measures, unspecified: Secondary | ICD-10-CM | POA: Diagnosis not present

## 2017-09-06 DIAGNOSIS — H04123 Dry eye syndrome of bilateral lacrimal glands: Secondary | ICD-10-CM | POA: Diagnosis not present

## 2018-03-08 ENCOUNTER — Emergency Department (HOSPITAL_COMMUNITY): Payer: Medicare Other

## 2018-03-08 ENCOUNTER — Encounter (HOSPITAL_COMMUNITY): Payer: Self-pay | Admitting: Emergency Medicine

## 2018-03-08 ENCOUNTER — Other Ambulatory Visit: Payer: Self-pay

## 2018-03-08 ENCOUNTER — Emergency Department (HOSPITAL_COMMUNITY)
Admission: EM | Admit: 2018-03-08 | Discharge: 2018-03-08 | Disposition: A | Discharge status: Home / Self Care | Payer: Medicare Other | Attending: Emergency Medicine | Admitting: Emergency Medicine

## 2018-03-08 DIAGNOSIS — N12 Tubulo-interstitial nephritis, not specified as acute or chronic: Secondary | ICD-10-CM | POA: Diagnosis not present

## 2018-03-08 DIAGNOSIS — E119 Type 2 diabetes mellitus without complications: Secondary | ICD-10-CM | POA: Diagnosis not present

## 2018-03-08 DIAGNOSIS — Z7982 Long term (current) use of aspirin: Secondary | ICD-10-CM | POA: Insufficient documentation

## 2018-03-08 DIAGNOSIS — R109 Unspecified abdominal pain: Secondary | ICD-10-CM | POA: Diagnosis not present

## 2018-03-08 LAB — CBC WITH DIFFERENTIAL/PLATELET
BASOS ABS: 0.2 10*3/uL — AB (ref 0.0–0.1)
Basophils Relative: 3 %
EOS PCT: 4 %
Eosinophils Absolute: 0.2 10*3/uL (ref 0.0–0.7)
HEMATOCRIT: 42 % (ref 36.0–46.0)
Hemoglobin: 13.2 g/dL (ref 12.0–15.0)
LYMPHS ABS: 1.7 10*3/uL (ref 0.7–4.0)
LYMPHS PCT: 32 %
MCH: 29.6 pg (ref 26.0–34.0)
MCHC: 31.4 g/dL (ref 30.0–36.0)
MCV: 94.2 fL (ref 78.0–100.0)
MONO ABS: 0.5 10*3/uL (ref 0.1–1.0)
MONOS PCT: 10 %
NEUTROS ABS: 2.7 10*3/uL (ref 1.7–7.7)
Neutrophils Relative %: 51 %
Platelets: 308 10*3/uL (ref 150–400)
RBC: 4.46 MIL/uL (ref 3.87–5.11)
RDW: 13 % (ref 11.5–15.5)
WBC: 5.4 10*3/uL (ref 4.0–10.5)

## 2018-03-08 LAB — BASIC METABOLIC PANEL
ANION GAP: 9 (ref 5–15)
BUN: 12 mg/dL (ref 6–20)
CO2: 26 mmol/L (ref 22–32)
Calcium: 9.1 mg/dL (ref 8.9–10.3)
Chloride: 106 mmol/L (ref 101–111)
Creatinine, Ser: 0.79 mg/dL (ref 0.44–1.00)
GFR calc Af Amer: >60 mL/min (ref >60)
GFR calc non Af Amer: >60 mL/min (ref >60)
GLUCOSE: 113 mg/dL — AB (ref 65–99)
POTASSIUM: 4.2 mmol/L (ref 3.5–5.1)
Sodium: 141 mmol/L (ref 135–145)

## 2018-03-08 LAB — URINALYSIS, ROUTINE W REFLEX MICROSCOPIC
Bilirubin Urine: NEGATIVE
GLUCOSE, UA: NEGATIVE mg/dL
Ketones, ur: NEGATIVE mg/dL
Nitrite: NEGATIVE
PH: 7 (ref 5.0–8.0)
Protein, ur: NEGATIVE mg/dL
Specific Gravity, Urine: 1.003 — ABNORMAL LOW (ref 1.005–1.030)

## 2018-03-08 MED ORDER — CEPHALEXIN 500 MG PO CAPS: 500.0000 mg | ORAL_CAPSULE | Freq: Four times a day (QID) | ORAL | 0 refills | Status: DC

## 2018-03-08 MED ORDER — CEFTRIAXONE SODIUM 1 G IJ SOLR
1.0000 g | Freq: Once | INTRAMUSCULAR | Status: AC
  Administered 2018-03-08: 1 g via INTRAMUSCULAR
  Filled 2018-03-08: qty 10

## 2018-03-08 MED ORDER — LIDOCAINE HCL (PF) 1 % IJ SOLN
INTRAMUSCULAR | Status: AC
  Administered 2018-03-08: 2 mL
  Filled 2018-03-08: qty 2

## 2018-03-08 NOTE — ED Provider Notes (Signed)
[facility] EMERGENCY DEPARTMENT Provider Note   CSN: [id number] Arrival date & time: 03/08/18  1105     History   Chief Complaint Chief Complaint  Patient presents with  . Flank Pain    started this am    HPI Debra Olsen is a 82 y.o. female.  HPI   Debra Olsen is a 82 y.o. female who presents to the Emergency Department complaining of left-sided flank pain for several days.  Describes the pain as an aching pain to her left flank.  Pain worse this morning upon waking.  She states the pain is not associated with movement or with urination.  She states that she has had flank pain similar to this in the past that was associated with a urinary tract infection.  She denies nausea or vomiting, fever, chest pain, shortness of breath, pain to the lower extremities abdominal or groin pain.      Past Medical History:  Diagnosis Date  . Arthritis   . Diabetes mellitus without complication ([place])   . Diverticulosis of intestine   . GERD (gastroesophageal reflux disease)     There are no active problems to display for this patient.   Past Surgical History:  Procedure Laterality Date  . ABDOMINAL HYSTERECTOMY      OB History    No data available       Home Medications    Prior to Admission medications   Medication Sig Start Date End Date Taking? Authorizing Provider  acetaminophen (TYLENOL) 500 MG tablet Take 1,000 mg by mouth every 6 (six) hours as needed for mild pain.    [provider]  aspirin EC 81 MG tablet Take 81 mg by mouth daily.    [provider]  diphenhydrAMINE (BENADRYL) 25 MG tablet 25 mg at bedtime.     [provider]  ibuprofen (ADVIL,MOTRIN) 200 MG tablet Take 200 mg by mouth every 6 (six) hours as needed for mild pain or moderate pain.    [provider]    Family History Family History  Problem Relation Age of Onset  . Heart failure Father   . Cancer Sister   . Cancer Other     Social History Social  History   Tobacco Use  . Smoking status: Never Smoker  . Smokeless tobacco: Never Used  Substance Use Topics  . Alcohol use: No  . Drug use: No     Allergies   Lidocaine and Penicillins   Review of Systems Review of Systems  Constitutional: Negative for activity change, appetite change, chills and fever.  Respiratory: Negative for chest tightness and shortness of breath.   Cardiovascular: Negative for chest pain.  Gastrointestinal: Negative for abdominal pain, nausea and vomiting.  Genitourinary: Positive for flank pain. Negative for decreased urine volume, difficulty urinating, dysuria, frequency, hematuria and urgency.  Musculoskeletal: Negative for back pain.  Skin: Negative for rash.  Neurological: Negative for dizziness, weakness and numbness.  Hematological: Negative for adenopathy.  Psychiatric/Behavioral: Negative for confusion.  All other systems reviewed and are negative.    Physical Exam Updated Vital Signs BP (!) 157/97 (BP Location: Right Arm)   Pulse 78   Temp 98 F (36.7 C) (Oral)   Resp 18   Ht 5\' 8"  (1.727 m)   Wt 90.7 kg (200 lb)   SpO2 99%   BMI 30.41 kg/m   Physical Exam  Constitutional: She is oriented to person, place, and time. She appears well-developed and well-nourished. No distress.  HENT:  Head: Atraumatic.  Mouth/Throat: Oropharynx is clear and moist.  Neck: Normal range of motion.  Cardiovascular: Normal rate and regular rhythm.  No murmur heard. Abdominal: Soft. She exhibits no distension and no mass. There is no tenderness. There is no rebound and no guarding.  Mild left-sided CVA tenderness.  Abdomen is soft and nontender with no guarding or rebound.  Musculoskeletal: Normal range of motion.  Lymphadenopathy:    She has no cervical adenopathy.  Neurological: She is alert and oriented to person, place, and time. No sensory deficit.  Skin: Skin is warm. Capillary refill takes less than 2 seconds. No rash noted.  Nursing note  and vitals reviewed.    ED Treatments / Results  Labs (all labs ordered are listed, but only abnormal results are displayed) Labs Reviewed  URINALYSIS, ROUTINE W REFLEX MICROSCOPIC - Abnormal; Notable for the following components:      Result Value   Color, Urine STRAW (*)    Specific Gravity, Urine 1.003 (*)    Hgb urine dipstick SMALL (*)    Leukocytes, UA LARGE (*)    Bacteria, UA RARE (*)    Squamous Epithelial / LPF 0-5 (*)    All other components within normal limits  BASIC METABOLIC PANEL - Abnormal; Notable for the following components:   Glucose, Bld 113 (*)    All other components within normal limits  CBC WITH DIFFERENTIAL/PLATELET - Abnormal; Notable for the following components:   Basophils Absolute 0.2 (*)    All other components within normal limits  URINE CULTURE    EKG  EKG Interpretation None       Radiology Ct Renal Stone Study  Result Date: 03/08/2018 CLINICAL DATA:  Left flank pain EXAM: CT ABDOMEN AND PELVIS WITHOUT CONTRAST TECHNIQUE: Multidetector CT imaging of the abdomen and pelvis was performed following the standard protocol without IV contrast. COMPARISON:  None. FINDINGS: Lower chest: No acute abnormality. Small visceral pleural node in the left major fissure. Hepatobiliary: Unremarkable Pancreas: Unremarkable Spleen: Unremarkable Adrenals/Urinary Tract: Renal pelvises are prominent bilaterally. No ureteral calculus. No other evidence of hydronephrosis. Adrenal glands are unremarkable. Bladder contains gas. Stomach/Bowel: Appendix is not clearly visualized. No obvious evidence of acute appendicitis. No obvious mass in the colon. Diverticulosis of the sigmoid colon. No evidence of acute diverticulitis. No evidence of small-bowel obstruction. Stomach is decompressed. Vascular/Lymphatic: Atherosclerotic calcification in the aorta and iliac arteries. No evidence of abnormal retroperitoneal adenopathy. Reproductive: Uterus is absent.  Adnexa are within  normal limits. Other: No free fluid. Musculoskeletal: No vertebral compression deformity. IMPRESSION: No evidence of urinary calculus or urinary obstruction. No acute intra-abdominal pathology. Electronically Signed   By: Marybelle Killings M.D.   On: 03/08/2018 13:34    Procedures Procedures (including critical care time)  Medications Ordered in ED Medications  cefTRIAXone (ROCEPHIN) injection 1 g (not administered)     Initial Impression / Assessment and Plan / ED Course  I have reviewed the triage vital signs and the nursing notes.  Pertinent labs & imaging results that were available during my care of the patient were reviewed by me and considered in my medical decision making (see chart for details).     Pt also seen by Dr. Reather Converse and care plan discussed.  Patient is well-appearing.  Nontoxic left flank pain that is recurrent and felt to be related to early pyelonephritis.  Patient agrees to treatment plan and close outpatient follow-up.  Return precautions were discussed.    Final Clinical Impressions(s) / ED Diagnoses  Final diagnoses:  Pyelonephritis    ED Discharge Orders    None       Bufford Lope 03/08/18 2054    Elnora Morrison, MD 03/11/18 [phone]

## 2018-03-08 NOTE — Discharge Instructions (Signed)
Drink plenty of water.  Take the antibiotic as directed until its finished.  Follow-up with your doctor for recheck in a few days.  Return here for any worsening symptoms

## 2018-03-08 NOTE — ED Triage Notes (Signed)
Hx of L flank pain usually resolves along with UTI Since awakening this am

## 2018-03-11 LAB — URINE CULTURE: Culture: 60000 — AB

## 2018-03-12 ENCOUNTER — Telehealth: Payer: Self-pay | Admitting: Emergency Medicine

## 2018-03-12 NOTE — Telephone Encounter (Signed)
Post ED Visit - Positive Culture Follow-up  Culture report reviewed by antimicrobial stewardship pharmacist:  []  Elenor Quinones, Pharm.D. []  Heide Guile, Pharm.D., BCPS AQ-ID []  Parks Neptune, Pharm.D., BCPS [x]  Alycia Rossetti, Pharm.D., BCPS []  [place], Pharm.D., BCPS, AAHIVP []  Legrand Como, Pharm.D., BCPS, AAHIVP []  Salome Arnt, PharmD, BCPS []  Jalene Mullet, PharmD []  Vincenza Hews, PharmD, BCPS  Positive urine culture Treated with cephalexin, organism sensitive to the same and no further patient follow-up is required at this time.  Hazle Nordmann 03/12/2018, 2:38 PM

## 2018-03-12 NOTE — Telephone Encounter (Signed)
Post ED Visit - Positive Culture Follow-up  Culture report reviewed by antimicrobial stewardship pharmacist:  []  Elenor Quinones, Pharm.D. []  Heide Guile, Pharm.D., BCPS AQ-ID []  [place] Ne[place]une, Pharm.D., BCPS []  Alycia Rossetti, Pharm.D., BCPS []  [place], Pharm.D., BCPS, AAHIVP [x]  Legrand Como, Pharm.D., BCPS, AAHIVP []  Salome Arnt, PharmD, BCPS []  Jalene Mullet, PharmD []  Vincenza Hews, PharmD, BCPS  Positive urine culture Treated with urine, organism sensitive to the same and no further patient follow-up is required at this time.  Hazle Nordmann 03/12/2018, 2:36 PM

## 2018-10-02 DIAGNOSIS — Z299 Encounter for prophylactic measures, unspecified: Secondary | ICD-10-CM | POA: Diagnosis not present

## 2018-10-02 DIAGNOSIS — Z6830 Body mass index (BMI) 30.0-30.9, adult: Secondary | ICD-10-CM | POA: Diagnosis not present

## 2018-10-02 DIAGNOSIS — H1132 Conjunctival hemorrhage, left eye: Secondary | ICD-10-CM | POA: Diagnosis not present

## 2018-10-02 DIAGNOSIS — E7800 Pure hypercholesterolemia, unspecified: Secondary | ICD-10-CM | POA: Diagnosis not present

## 2018-10-02 DIAGNOSIS — M1710 Unilateral primary osteoarthritis, unspecified knee: Secondary | ICD-10-CM | POA: Diagnosis not present

## 2018-10-16 DIAGNOSIS — R5383 Other fatigue: Secondary | ICD-10-CM | POA: Diagnosis not present

## 2018-10-16 DIAGNOSIS — E7800 Pure hypercholesterolemia, unspecified: Secondary | ICD-10-CM | POA: Diagnosis not present

## 2018-10-16 DIAGNOSIS — Z87891 Personal history of nicotine dependence: Secondary | ICD-10-CM | POA: Diagnosis not present

## 2018-10-16 DIAGNOSIS — Z1331 Encounter for screening for depression: Secondary | ICD-10-CM | POA: Diagnosis not present

## 2018-10-16 DIAGNOSIS — Z0000 Encounter for general adult medical examination without abnormal findings: Secondary | ICD-10-CM | POA: Diagnosis not present

## 2018-10-16 DIAGNOSIS — Z6830 Body mass index (BMI) 30.0-30.9, adult: Secondary | ICD-10-CM | POA: Diagnosis not present

## 2018-10-16 DIAGNOSIS — R42 Dizziness and giddiness: Secondary | ICD-10-CM | POA: Diagnosis not present

## 2018-10-16 DIAGNOSIS — Z299 Encounter for prophylactic measures, unspecified: Secondary | ICD-10-CM | POA: Diagnosis not present

## 2018-10-16 DIAGNOSIS — Z7189 Other specified counseling: Secondary | ICD-10-CM | POA: Diagnosis not present

## 2018-10-16 DIAGNOSIS — Z1211 Encounter for screening for malignant neoplasm of colon: Secondary | ICD-10-CM | POA: Diagnosis not present

## 2018-10-16 DIAGNOSIS — Z1339 Encounter for screening examination for other mental health and behavioral disorders: Secondary | ICD-10-CM | POA: Diagnosis not present

## 2018-10-17 DIAGNOSIS — Z79899 Other long term (current) drug therapy: Secondary | ICD-10-CM | POA: Diagnosis not present

## 2018-10-17 DIAGNOSIS — E7800 Pure hypercholesterolemia, unspecified: Secondary | ICD-10-CM | POA: Diagnosis not present

## 2018-10-17 DIAGNOSIS — R5383 Other fatigue: Secondary | ICD-10-CM | POA: Diagnosis not present

## 2018-10-21 ENCOUNTER — Encounter (INDEPENDENT_AMBULATORY_CARE_PROVIDER_SITE_OTHER): Payer: Self-pay | Admitting: *Deleted

## 2018-10-24 DIAGNOSIS — Z961 Presence of intraocular lens: Secondary | ICD-10-CM | POA: Diagnosis not present

## 2020-02-17 DIAGNOSIS — R300 Dysuria: Secondary | ICD-10-CM | POA: Diagnosis not present

## 2020-02-17 DIAGNOSIS — Z1339 Encounter for screening examination for other mental health and behavioral disorders: Secondary | ICD-10-CM | POA: Diagnosis not present

## 2020-02-17 DIAGNOSIS — Z0000 Encounter for general adult medical examination without abnormal findings: Secondary | ICD-10-CM | POA: Diagnosis not present

## 2020-02-17 DIAGNOSIS — E7800 Pure hypercholesterolemia, unspecified: Secondary | ICD-10-CM | POA: Diagnosis not present

## 2020-02-17 DIAGNOSIS — Z7189 Other specified counseling: Secondary | ICD-10-CM | POA: Diagnosis not present

## 2020-02-17 DIAGNOSIS — Z299 Encounter for prophylactic measures, unspecified: Secondary | ICD-10-CM | POA: Diagnosis not present

## 2020-02-17 DIAGNOSIS — Z79899 Other long term (current) drug therapy: Secondary | ICD-10-CM | POA: Diagnosis not present

## 2020-02-17 DIAGNOSIS — R5383 Other fatigue: Secondary | ICD-10-CM | POA: Diagnosis not present

## 2020-02-17 DIAGNOSIS — Z6829 Body mass index (BMI) 29.0-29.9, adult: Secondary | ICD-10-CM | POA: Diagnosis not present

## 2020-02-17 DIAGNOSIS — Z1211 Encounter for screening for malignant neoplasm of colon: Secondary | ICD-10-CM | POA: Diagnosis not present

## 2020-02-17 DIAGNOSIS — Z1331 Encounter for screening for depression: Secondary | ICD-10-CM | POA: Diagnosis not present

## 2020-02-29 ENCOUNTER — Observation Stay (HOSPITAL_BASED_OUTPATIENT_CLINIC_OR_DEPARTMENT_OTHER): Payer: Medicare Other

## 2020-02-29 ENCOUNTER — Emergency Department (HOSPITAL_COMMUNITY): Payer: Medicare Other

## 2020-02-29 ENCOUNTER — Observation Stay (HOSPITAL_COMMUNITY): Payer: Medicare Other

## 2020-02-29 ENCOUNTER — Observation Stay (HOSPITAL_COMMUNITY)
Admission: EM | Admit: 2020-02-29 | Discharge: 2020-03-01 | Disposition: A | Discharge status: Home / Self Care | Payer: Medicare Other | Attending: Internal Medicine | Admitting: Internal Medicine

## 2020-02-29 ENCOUNTER — Encounter (HOSPITAL_COMMUNITY): Payer: Self-pay

## 2020-02-29 ENCOUNTER — Other Ambulatory Visit: Payer: Self-pay

## 2020-02-29 ENCOUNTER — Inpatient Hospital Stay (HOSPITAL_COMMUNITY): Payer: Medicare Other

## 2020-02-29 DIAGNOSIS — Z7982 Long term (current) use of aspirin: Secondary | ICD-10-CM | POA: Diagnosis not present

## 2020-02-29 DIAGNOSIS — E119 Type 2 diabetes mellitus without complications: Secondary | ICD-10-CM | POA: Diagnosis not present

## 2020-02-29 DIAGNOSIS — I6523 Occlusion and stenosis of bilateral carotid arteries: Secondary | ICD-10-CM | POA: Diagnosis not present

## 2020-02-29 DIAGNOSIS — Z20822 Contact with and (suspected) exposure to covid-19: Secondary | ICD-10-CM | POA: Diagnosis not present

## 2020-02-29 DIAGNOSIS — N390 Urinary tract infection, site not specified: Secondary | ICD-10-CM

## 2020-02-29 DIAGNOSIS — Z79899 Other long term (current) drug therapy: Secondary | ICD-10-CM | POA: Diagnosis not present

## 2020-02-29 DIAGNOSIS — G459 Transient cerebral ischemic attack, unspecified: Secondary | ICD-10-CM | POA: Diagnosis not present

## 2020-02-29 DIAGNOSIS — I651 Occlusion and stenosis of basilar artery: Secondary | ICD-10-CM | POA: Insufficient documentation

## 2020-02-29 DIAGNOSIS — E785 Hyperlipidemia, unspecified: Secondary | ICD-10-CM | POA: Diagnosis not present

## 2020-02-29 DIAGNOSIS — D519 Vitamin B12 deficiency anemia, unspecified: Secondary | ICD-10-CM | POA: Diagnosis not present

## 2020-02-29 DIAGNOSIS — R42 Dizziness and giddiness: Secondary | ICD-10-CM | POA: Diagnosis not present

## 2020-02-29 DIAGNOSIS — R29818 Other symptoms and signs involving the nervous system: Secondary | ICD-10-CM | POA: Diagnosis not present

## 2020-02-29 DIAGNOSIS — Z03818 Encounter for observation for suspected exposure to other biological agents ruled out: Secondary | ICD-10-CM | POA: Diagnosis not present

## 2020-02-29 DIAGNOSIS — E538 Deficiency of other specified B group vitamins: Secondary | ICD-10-CM | POA: Diagnosis present

## 2020-02-29 DIAGNOSIS — Z8673 Personal history of transient ischemic attack (TIA), and cerebral infarction without residual deficits: Secondary | ICD-10-CM | POA: Diagnosis present

## 2020-02-29 LAB — ECHOCARDIOGRAM COMPLETE
Height: 68 in
Weight: 3120 oz

## 2020-02-29 LAB — LIPID PANEL
Cholesterol: 277 mg/dL — ABNORMAL HIGH (ref 0–200)
HDL: 58 mg/dL (ref >40)
LDL Cholesterol: 197 mg/dL — ABNORMAL HIGH (ref 0–99)
Total CHOL/HDL Ratio: 4.8 RATIO
Triglycerides: 111 mg/dL (ref <150)
VLDL: 22 mg/dL (ref 0–40)

## 2020-02-29 LAB — SARS CORONAVIRUS 2 (TAT 6-24 HRS): SARS Coronavirus 2: NEGATIVE

## 2020-02-29 LAB — DIFFERENTIAL
Abs Immature Granulocytes: 0.02 10*3/uL (ref 0.00–0.07)
Basophils Absolute: 0.2 10*3/uL — ABNORMAL HIGH (ref 0.0–0.1)
Basophils Relative: 2 %
Eosinophils Absolute: 0.3 10*3/uL (ref 0.0–0.5)
Eosinophils Relative: 5 %
Immature Granulocytes: 0 %
Lymphocytes Relative: 29 %
Lymphs Abs: 2.2 10*3/uL (ref 0.7–4.0)
Monocytes Absolute: 0.9 10*3/uL (ref 0.1–1.0)
Monocytes Relative: 12 %
Neutro Abs: 3.9 10*3/uL (ref 1.7–7.7)
Neutrophils Relative %: 52 %

## 2020-02-29 LAB — URINALYSIS, ROUTINE W REFLEX MICROSCOPIC
Bilirubin Urine: NEGATIVE
Glucose, UA: NEGATIVE mg/dL
Ketones, ur: NEGATIVE mg/dL
Nitrite: NEGATIVE
Protein, ur: NEGATIVE mg/dL
Specific Gravity, Urine: 1.004 — ABNORMAL LOW (ref 1.005–1.030)
WBC, UA: >50 WBC/hpf — ABNORMAL HIGH (ref 0–5)
pH: 7 (ref 5.0–8.0)

## 2020-02-29 LAB — COMPREHENSIVE METABOLIC PANEL
ALT: 12 U/L (ref 0–44)
AST: 19 U/L (ref 15–41)
Albumin: 3.7 g/dL (ref 3.5–5.0)
Alkaline Phosphatase: 69 U/L (ref 38–126)
Anion gap: 8 (ref 5–15)
BUN: 17 mg/dL (ref 8–23)
CO2: 26 mmol/L (ref 22–32)
Calcium: 9.1 mg/dL (ref 8.9–10.3)
Chloride: 106 mmol/L (ref 98–111)
Creatinine, Ser: 0.98 mg/dL (ref 0.44–1.00)
GFR calc Af Amer: >60 mL/min (ref >60)
GFR calc non Af Amer: 54 mL/min — ABNORMAL LOW (ref >60)
Glucose, Bld: 115 mg/dL — ABNORMAL HIGH (ref 70–99)
Potassium: 4 mmol/L (ref 3.5–5.1)
Sodium: 140 mmol/L (ref 135–145)
Total Bilirubin: 0.5 mg/dL (ref 0.3–1.2)
Total Protein: 7.6 g/dL (ref 6.5–8.1)

## 2020-02-29 LAB — RAPID URINE DRUG SCREEN, HOSP PERFORMED
Amphetamines: NOT DETECTED
Barbiturates: NOT DETECTED
Benzodiazepines: NOT DETECTED
Cocaine: NOT DETECTED
Opiates: NOT DETECTED
Tetrahydrocannabinol: NOT DETECTED

## 2020-02-29 LAB — HEMOGLOBIN A1C
Hgb A1c MFr Bld: 6 % — ABNORMAL HIGH (ref 4.8–5.6)
Mean Plasma Glucose: 125.5 mg/dL

## 2020-02-29 LAB — TSH: TSH: 2.75 u[IU]/mL (ref 0.350–4.500)

## 2020-02-29 LAB — CBC
HCT: 41.8 % (ref 36.0–46.0)
Hemoglobin: 13.5 g/dL (ref 12.0–15.0)
MCH: 31 pg (ref 26.0–34.0)
MCHC: 32.3 g/dL (ref 30.0–36.0)
MCV: 96.1 fL (ref 80.0–100.0)
Platelets: 310 10*3/uL (ref 150–400)
RBC: 4.35 MIL/uL (ref 3.87–5.11)
RDW: 12.6 % (ref 11.5–15.5)
WBC: 7.6 10*3/uL (ref 4.0–10.5)
nRBC: 0 % (ref 0.0–0.2)

## 2020-02-29 LAB — PROTIME-INR
INR: 1 (ref 0.8–1.2)
Prothrombin Time: 12.8 seconds (ref 11.4–15.2)

## 2020-02-29 LAB — APTT: aPTT: 25 seconds (ref 24–36)

## 2020-02-29 LAB — VITAMIN B12: Vitamin B-12: 151 pg/mL — ABNORMAL LOW (ref 180–914)

## 2020-02-29 LAB — ETHANOL: Alcohol, Ethyl (B): <10 mg/dL (ref <10)

## 2020-02-29 MED ORDER — SENNOSIDES-DOCUSATE SODIUM 8.6-50 MG PO TABS
1.0000 | ORAL_TABLET | Freq: Every evening | ORAL | Status: DC | PRN
  Filled 2020-02-29: qty 1

## 2020-02-29 MED ORDER — CYANOCOBALAMIN 1000 MCG/ML IJ SOLN
1000.0000 ug | Freq: Once | INTRAMUSCULAR | Status: AC
  Administered 2020-02-29: 12:00:00 1000 ug via INTRAMUSCULAR
  Filled 2020-02-29: qty 1

## 2020-02-29 MED ORDER — IOHEXOL 350 MG/ML SOLN: 40.0000 mL | Freq: Once | INTRAVENOUS | Status: DC | PRN

## 2020-02-29 MED ORDER — ACETAMINOPHEN 650 MG RE SUPP: 650.0000 mg | RECTAL | Status: DC | PRN

## 2020-02-29 MED ORDER — CLOPIDOGREL BISULFATE 75 MG PO TABS
75.0000 mg | ORAL_TABLET | Freq: Every day | ORAL | Status: DC
  Administered 2020-03-01: 09:00:00 75 mg via ORAL
  Filled 2020-02-29: qty 1

## 2020-02-29 MED ORDER — ATORVASTATIN CALCIUM 10 MG PO TABS
20.0000 mg | ORAL_TABLET | Freq: Every day | ORAL | Status: DC
  Administered 2020-02-29: 19:00:00 20 mg via ORAL
  Filled 2020-02-29: qty 2

## 2020-02-29 MED ORDER — SULFAMETHOXAZOLE-TRIMETHOPRIM 800-160 MG PO TABS
1.0000 | ORAL_TABLET | Freq: Once | ORAL | Status: AC
  Administered 2020-02-29: 05:00:00 1 via ORAL
  Filled 2020-02-29: qty 1

## 2020-02-29 MED ORDER — SULFAMETHOXAZOLE-TRIMETHOPRIM 800-160 MG PO TABS
1.0000 | ORAL_TABLET | Freq: Two times a day (BID) | ORAL | Status: DC
  Administered 2020-02-29 – 2020-03-01 (×2): 1 via ORAL
  Filled 2020-02-29 (×2): qty 1

## 2020-02-29 MED ORDER — IOHEXOL 350 MG/ML SOLN
115.0000 mL | Freq: Once | INTRAVENOUS | Status: AC | PRN
  Administered 2020-02-29: 115 mL via INTRAVENOUS

## 2020-02-29 MED ORDER — STROKE: EARLY STAGES OF RECOVERY BOOK
Freq: Once | Status: AC
  Filled 2020-02-29 (×2): qty 1

## 2020-02-29 MED ORDER — ATORVASTATIN CALCIUM 80 MG PO TABS: 80.0000 mg | ORAL_TABLET | Freq: Every day | ORAL | Status: DC

## 2020-02-29 MED ORDER — ASPIRIN EC 81 MG PO TBEC
81.0000 mg | DELAYED_RELEASE_TABLET | Freq: Every day | ORAL | Status: DC
  Administered 2020-03-01: 81 mg via ORAL
  Filled 2020-02-29: qty 1

## 2020-02-29 MED ORDER — ACETAMINOPHEN 325 MG PO TABS: 650.0000 mg | ORAL_TABLET | ORAL | Status: DC | PRN

## 2020-02-29 MED ORDER — ASPIRIN 325 MG PO TABS
325.0000 mg | ORAL_TABLET | Freq: Every day | ORAL | Status: DC
  Administered 2020-02-29: 13:00:00 325 mg via ORAL
  Filled 2020-02-29: qty 1

## 2020-02-29 MED ORDER — ASPIRIN 300 MG RE SUPP: 300.0000 mg | Freq: Every day | RECTAL | Status: DC

## 2020-02-29 MED ORDER — VITAMIN B-12 1000 MCG PO TABS
1000.0000 ug | ORAL_TABLET | Freq: Every day | ORAL | Status: DC
  Administered 2020-02-29 – 2020-03-01 (×2): 1000 ug via ORAL
  Filled 2020-02-29 (×3): qty 1

## 2020-02-29 MED ORDER — ACETAMINOPHEN 160 MG/5ML PO SOLN: 650.0000 mg | ORAL | Status: DC | PRN

## 2020-02-29 MED ORDER — SODIUM CHLORIDE 0.9 % IV SOLN: INTRAVENOUS | Status: DC

## 2020-02-29 MED ORDER — ENOXAPARIN SODIUM 40 MG/0.4ML ~~LOC~~ SOLN
40.0000 mg | SUBCUTANEOUS | Status: DC
  Administered 2020-02-29: 19:00:00 40 mg via SUBCUTANEOUS
  Filled 2020-02-29: qty 0.4

## 2020-02-29 NOTE — ED Notes (Signed)
Patient given water per MD approval. 

## 2020-02-29 NOTE — ED Notes (Signed)
Spoke with patient placement about pending bed status at [facility]. Patient placement states we are waiting for a Neuro bed at this time there are no available beds [facility] RN notified.

## 2020-02-29 NOTE — ED Notes (Signed)
Tele neuro started 

## 2020-02-29 NOTE — Progress Notes (Addendum)
Pt arrived on unit from [facility]. Received report from [place], [place]. VS and NIHSS obtained. Neurologist team notified of patient's arrival.

## 2020-02-29 NOTE — Care Management Obs Status (Signed)
[place] NOTIFICATION   Patient Details  Name: Debra Olsen MRN: [id number] Date of Birth: 09-Jun-1936   Medicare Observation Status Notification Given:  Yes    Boneta Lucks, RN 02/29/2020, 1:52 PM

## 2020-02-29 NOTE — Progress Notes (Signed)
*  PRELIMINARY RESULTS* Echocardiogram 2D Echocardiogram has been performed.  Leavy Cella 02/29/2020, 3:09 PM

## 2020-02-29 NOTE — Progress Notes (Signed)
Patient admitted to the hospital earlier this morning by Dr. Darrick Meigs  Patient seen and examined. She reports that left facial numbness is improving. No dizziness or changes in vision. Motor exam is symmetrical, cranial nerves appear to be grossly intact  A/P:  1. Left-sided facial numbness, concern for TIA versus stroke.  Patient seen by tele-neurology and underwent CTA of head and neck as well as CT cerebral perfusion study.  No acute infarction by CT perfusion study reported.  Further recommendations including MRI brain and echocardiogram.  Since MRI services are not available at [facility], she is being transferred to [facility] for further evaluation.  She has been started on aspirin and statin.  Hemoglobin A1c and lipid panel have been ordered.  TSH normal. 2. B12 deficiency.  Ordered as part of work-up for facial numbness.  B12 levels noted to be 150 range.  Started on B12 supplementation. 3. Possible UTI.  Urinalysis indicates possible UTI.  Urine culture in process.  Started on Bactrim.  [employer]

## 2020-02-29 NOTE — ED Notes (Signed)
Spoke with Ruby at [facility] to set up transporation to [facility].

## 2020-02-29 NOTE — H&P (Signed)
TRH H&P    Patient Demographics:    Debra Olsen, is a 84 y.o. female  MRN: [id number]  DOB - 10-05-1936  Admit Date - 02/29/2020  Referring MD/NP/PA: Dr. Tomi Bamberger  Outpatient Primary MD for the patient is Glenda Chroman, MD  Patient coming from: Home  Chief complaint-facial numbness   HPI:    Debra Olsen  is a 84 y.o. female, with history of diabetes mellitus without any complication, diverticulosis, GERD, arthritis came to hospital with complaints of numbness of left side of her face.  Patient said that lasted for about an hour.  It started on 1030 tonight.  Patient denies any blurred vision, no slurred speech , no difficulty swallowing, denies focal weakness of arms or legs.  But states her left arm felt funny and weird.  She denies previous history of stroke or seizures.  She has been having dizzy episodes for past few weeks.  Patient took 3 baby aspirin after facial numbness started.  She has diabetes but does not take any medications. In the ED tele neurology was consulted, patient underwent CTA head and neck and CT cerebral perfusion study.  All the studies were unremarkable.  MRI brain and echocardiogram has been recommended.  She denies chest pain or shortness of breath. Denies abdominal pain or dysuria. Denies nausea vomiting or diarrhea.    Review of systems:    In addition to the HPI above,   All other systems reviewed and are negative.    Past History of the following :    Past Medical History:  Diagnosis Date  . Arthritis   . Diabetes mellitus without complication ([place])   . Diverticulosis of intestine   . GERD (gastroesophageal reflux disease)       Past Surgical History:  Procedure Laterality Date  . ABDOMINAL HYSTERECTOMY        Social History:      Social History   Tobacco Use  . Smoking status: Never Smoker  . Smokeless tobacco: Never Used  Substance Use Topics  .  Alcohol use: No       Family History :     Family History  Problem Relation Age of Onset  . Heart failure Father   . Cancer Sister   . Cancer Other       Home Medications:   Prior to Admission medications   Medication Sig Start Date End Date Taking? Authorizing Provider  acetaminophen (TYLENOL) 500 MG tablet Take 1,000 mg by mouth every 6 (six) hours as needed for mild pain.    [provider]  aspirin EC 81 MG tablet Take 81 mg by mouth daily.    [provider]  cephALEXin (KEFLEX) 500 MG capsule Take 1 capsule (500 mg total) by mouth 4 (four) times daily. For 7 days 03/08/18   Triplett, Tammy, PA-C  diphenhydrAMINE (BENADRYL) 25 MG tablet 25 mg at bedtime.     [provider]  ibuprofen (ADVIL,MOTRIN) 200 MG tablet Take 200 mg by mouth every 6 (six) hours as needed for mild pain or  moderate pain.    [provider]     Allergies:     Allergies  Allergen Reactions  . Lidocaine Other (See Comments)    Passed out  . Penicillins Rash         Physical Exam:   Vitals  Blood pressure (!) 165/103, pulse 95, temperature 98.4 F (36.9 C), temperature source Oral, resp. rate 15, height 5\' 8"  (1.727 m), weight 88.5 kg, SpO2 96 %.  1.  General: Appears in no acute distress  2. Psychiatric: Alert, oriented x3, intact insight and judgment  3. Neurologic: Cranial nerves II through grossly intact, motor strength 5/5 in all extremities, sensations intact bilaterally  4. HEENMT:  Atraumatic normocephalic, extraocular muscles are intact,  5. Respiratory : Clear to auscultation bilaterally, no wheezing or crackles auscultated  6. Cardiovascular : S1-S2, regular, no murmur auscultated  7. Gastrointestinal:  Abdomen is soft, nontender, no organomegaly     Data Review:    CBC Recent Labs  Lab 02/29/20 0151  WBC 7.6  HGB 13.5  HCT 41.8  PLT 310  MCV 96.1  MCH 31.0  MCHC 32.3  RDW 12.6  LYMPHSABS 2.2  MONOABS 0.9  EOSABS  0.3  BASOSABS 0.2*   ------------------------------------------------------------------------------------------------------------------  Results for orders placed or performed during the hospital encounter of 02/29/20 (from the past 48 hour(s))  Ethanol     Status: None   Collection Time: 02/29/20  1:51 AM  Result Value Ref Range   Alcohol, Ethyl (B) <10 <10 mg/dL    Comment: (NOTE) Lowest detectable limit for serum alcohol is 10 mg/dL. For medical purposes only. Performed at [facility], [address] [place], [place] [address]   Protime-INR     Status: None   Collection Time: 02/29/20  1:51 AM  Result Value Ref Range   Prothrombin Time 12.8 11.4 - 15.2 seconds   INR 1.0 0.8 - 1.2    Comment: (NOTE) INR goal varies based on device and disease states. Performed at [facility], [address] [place], [place] [address]   APTT     Status: None   Collection Time: 02/29/20  1:51 AM  Result Value Ref Range   aPTT 25 24 - 36 seconds    Comment: Performed at [facility], [address], [place], [place] [address]  CBC     Status: None   Collection Time: 02/29/20  1:51 AM  Result Value Ref Range   WBC 7.6 4.0 - 10.5 K/uL   RBC 4.35 3.87 - 5.11 MIL/uL   Hemoglobin 13.5 12.0 - 15.0 g/dL   HCT 41.8 36.0 - 46.0 %   MCV 96.1 80.0 - 100.0 fL   MCH 31.0 26.0 - 34.0 pg   MCHC 32.3 30.0 - 36.0 g/dL   RDW 12.6 11.5 - 15.5 %   Platelets 310 150 - 400 K/uL   nRBC 0.0 0.0 - 0.2 %    Comment: Performed at [facility], [address], [place], [place] [address]  Differential     Status: Abnormal   Collection Time: 02/29/20  1:51 AM  Result Value Ref Range   Neutrophils Relative % 52 %   Neutro Abs 3.9 1.7 - 7.7 K/uL   Lymphocytes Relative 29 %   Lymphs Abs 2.2 0.7 - 4.0 K/uL   Monocytes Relative 12 %   Monocytes Absolute 0.9 0.1 - 1.0 K/uL   Eosinophils Relative 5 %   Eosinophils Absolute 0.3 0.0 - 0.5 K/uL   Basophils Relative 2 %  Basophils Absolute 0.2 (H) 0.0 - 0.1 K/uL    Immature Granulocytes 0 %   Abs Immature Granulocytes 0.02 0.00 - 0.07 K/uL    Comment: Performed at [facility], [address], [place], [place] [address]  Comprehensive metabolic panel     Status: Abnormal   Collection Time: 02/29/20  1:51 AM  Result Value Ref Range   Sodium 140 135 - 145 mmol/L   Potassium 4.0 3.5 - 5.1 mmol/L   Chloride 106 98 - 111 mmol/L   CO2 26 22 - 32 mmol/L   Glucose, Bld 115 (H) 70 - 99 mg/dL    Comment: Glucose reference range applies only to samples taken after fasting for at least 8 hours.   BUN 17 8 - 23 mg/dL   Creatinine, Ser 0.98 0.44 - 1.00 mg/dL   Calcium 9.1 8.9 - 10.3 mg/dL   Total Protein 7.6 6.5 - 8.1 g/dL   Albumin 3.7 3.5 - 5.0 g/dL   AST 19 15 - 41 U/L   ALT 12 0 - 44 U/L   Alkaline Phosphatase 69 38 - 126 U/L   Total Bilirubin 0.5 0.3 - 1.2 mg/dL   GFR calc non Af Amer 54 (L) >60 mL/min   GFR calc Af Amer >60 >60 mL/min   Anion gap 8 5 - 15    Comment: Performed at [facility], [address], [place], [place] [address]  Urine rapid drug screen (hosp performed)     Status: None   Collection Time: 02/29/20  1:55 AM  Result Value Ref Range   Opiates NONE DETECTED NONE DETECTED   Cocaine NONE DETECTED NONE DETECTED   Benzodiazepines NONE DETECTED NONE DETECTED   Amphetamines NONE DETECTED NONE DETECTED   Tetrahydrocannabinol NONE DETECTED NONE DETECTED   Barbiturates NONE DETECTED NONE DETECTED    Comment: (NOTE) DRUG SCREEN FOR MEDICAL PURPOSES ONLY.  IF CONFIRMATION IS NEEDED FOR ANY PURPOSE, NOTIFY LAB WITHIN 5 DAYS. LOWEST DETECTABLE LIMITS FOR URINE DRUG SCREEN Drug Class                     Cutoff (ng/mL) Amphetamine and metabolites    1000 Barbiturate and metabolites    200 Benzodiazepine                 [id number] Tricyclics and metabolites     300 Opiates and metabolites        300 Cocaine and metabolites        300 THC                            50 Performed at [facility], [address] [place], [place] [address]     Urinalysis, Routine w reflex microscopic     Status: Abnormal   Collection Time: 02/29/20  1:55 AM  Result Value Ref Range   Color, Urine STRAW (A) YELLOW   APPearance HAZY (A) CLEAR   Specific Gravity, Urine 1.004 (L) 1.005 - 1.030   pH 7.0 5.0 - 8.0   Glucose, UA NEGATIVE NEGATIVE mg/dL   Hgb urine dipstick SMALL (A) NEGATIVE   Bilirubin Urine NEGATIVE NEGATIVE   Ketones, ur NEGATIVE NEGATIVE mg/dL   Protein, ur NEGATIVE NEGATIVE mg/dL   Nitrite NEGATIVE NEGATIVE   Leukocytes,Ua LARGE (A) NEGATIVE   RBC / HPF 21-50 0 - 5 RBC/hpf   WBC, UA >50 (H) 0 - 5 WBC/hpf   Bacteria, UA MANY (A) NONE SEEN  Squamous Epithelial / LPF 0-5 0 - 5   Non Squamous Epithelial 6-10 (A) NONE SEEN    Comment: Performed at [facility], [address] [place], [place] [address]    Chemistries  Recent Labs  Lab 02/29/20 0151  NA 140  K 4.0  CL 106  CO2 26  GLUCOSE 115*  BUN 17  CREATININE 0.98  CALCIUM 9.1  AST 19  ALT 12  ALKPHOS 69  BILITOT 0.5   ------------------------------------------------------------------------------------------------------------------  ------------------------------------------------------------------------------------------------------------------ GFR: Estimated Creatinine Clearance: 52.4 mL/min (by C-G formula based on SCr of 0.98 mg/dL). Liver Function Tests: Recent Labs  Lab 02/29/20 0151  AST 19  ALT 12  ALKPHOS 69  BILITOT 0.5  PROT 7.6  ALBUMIN 3.7   No results for input(s): LIPASE, AMYLASE in the last 168 hours. No results for input(s): AMMONIA in the last 168 hours. Coagulation Profile: Recent Labs  Lab 02/29/20 0151  INR 1.0    --------------------------------------------------------------------------------------------------------------- Urine analysis:    Component Value Date/Time   COLORURINE STRAW (A) 02/29/2020 0155   APPEARANCEUR HAZY (A) 02/29/2020 0155   LABSPEC 1.004 (L) 02/29/2020 0155   PHURINE 7.0 02/29/2020 0155    GLUCOSEU NEGATIVE 02/29/2020 0155   HGBUR SMALL (A) 02/29/2020 0155   BILIRUBINUR NEGATIVE 02/29/2020 0155   KETONESUR NEGATIVE 02/29/2020 0155   PROTEINUR NEGATIVE 02/29/2020 0155   NITRITE NEGATIVE 02/29/2020 0155   LEUKOCYTESUR LARGE (A) 02/29/2020 0155      Imaging Results:    CT Code Stroke CTA Head W/WO contrast  Result Date: 02/29/2020 CLINICAL DATA:  Left temporal field cut. Left sensory loss. Dizziness. Vertigo. EXAM: CT ANGIOGRAPHY HEAD AND NECK CT PERFUSION BRAIN TECHNIQUE: Multidetector CT imaging of the head and neck was performed using the standard protocol during bolus administration of intravenous contrast. Multiplanar CT image reconstructions and MIPs were obtained to evaluate the vascular anatomy. Carotid stenosis measurements (when applicable) are obtained utilizing NASCET criteria, using the distal internal carotid diameter as the denominator. Multiphase CT imaging of the brain was performed following IV bolus contrast injection. Subsequent parametric perfusion maps were calculated using RAPID software. CONTRAST:  131mL OMNIPAQUE IOHEXOL 350 MG/ML SOLN COMPARISON:  None. FINDINGS: CT HEAD FINDINGS Brain: Age related generalized atrophy. No sign of acute infarction, mass lesion, hemorrhage, hydrocephalus or extra-axial collection. Vascular: There is atherosclerotic calcification of the major vessels at the base of the brain. Skull: Negative Sinuses/Orbits: Clear/normal Other: None ASPECTS ([place] Stroke Program Early CT Score) - Ganglionic level infarction (caudate, lentiform nuclei, internal capsule, insula, M1-M3 cortex): 7 - Supraganglionic infarction (M4-M6 cortex): 3 Total score (0-10 with 10 being normal): 10 Review of the MIP images confirms the above findings CTA NECK FINDINGS Aortic arch: Aortic atherosclerosis. No aneurysm or dissection. Branching pattern is normal. Right carotid system: Common carotid artery widely patent to the bifurcation region. Calcified plaque at the  carotid bifurcation and ICA bulb. No stenosis of the ICA however. Left carotid system: Common carotid artery widely patent to the bifurcation. Calcified plaque at the carotid bifurcation and ICA bulb. No stenosis of the ICA however. Vertebral arteries: Both vertebral arteries widely patent at their origins and through the cervical region to the foramen magnum. Skeleton: Ordinary cervical spondylosis. Other neck: No mass or lymphadenopathy. Upper chest: No active process. Review of the MIP images confirms the above findings CTA HEAD FINDINGS Anterior circulation: Both internal carotid arteries widely patent through the skull base and siphon regions. Ordinary siphon atherosclerotic calcification but no stenosis. The anterior and middle cerebral vessels  are widely patent. No large or medium vessel occlusion. 3 mm atherosclerotic aneurysm projecting medially from the supraclinoid ICA on the right. Posterior circulation: Both vertebral arteries widely patent to the basilar. Stenosis of the distal basilar artery of 50%. Flow is present in both superior cerebellar and posterior cerebral arteries. Fetal origin of the left PCA. Venous sinuses: Patent and normal. Anatomic variants: None significant otherwise. Review of the MIP images confirms the above findings CT Brain Perfusion Findings: ASPECTS: 10 CBF (<30%) Volume: 40mL Perfusion (Tmax>6.0s) volume: 38mL Mismatch Volume: 68mL Infarction Location:None IMPRESSION: No acute infarction by CT or perfusion study. Aortic atherosclerosis. Mild atherosclerotic disease at both carotid bifurcations but no flow limiting stenosis. No intracranial large or medium vessel occlusion. 3 mm atherosclerotic aneurysm of the supraclinoid ICA on the right, not likely clinically significant. 50% distal basilar artery stenosis but with sufficient perfusion of the posterior circulation vessels by CTA. Findings discussed with Dr. Tomi Bamberger at 0320 hours. Electronically Signed   By: Nelson Chimes M.D.    On: 02/29/2020 03:27   CT Code Stroke CTA Neck W/WO contrast  Result Date: 02/29/2020 CLINICAL DATA:  Left temporal field cut. Left sensory loss. Dizziness. Vertigo. EXAM: CT ANGIOGRAPHY HEAD AND NECK CT PERFUSION BRAIN TECHNIQUE: Multidetector CT imaging of the head and neck was performed using the standard protocol during bolus administration of intravenous contrast. Multiplanar CT image reconstructions and MIPs were obtained to evaluate the vascular anatomy. Carotid stenosis measurements (when applicable) are obtained utilizing NASCET criteria, using the distal internal carotid diameter as the denominator. Multiphase CT imaging of the brain was performed following IV bolus contrast injection. Subsequent parametric perfusion maps were calculated using RAPID software. CONTRAST:  169mL OMNIPAQUE IOHEXOL 350 MG/ML SOLN COMPARISON:  None. FINDINGS: CT HEAD FINDINGS Brain: Age related generalized atrophy. No sign of acute infarction, mass lesion, hemorrhage, hydrocephalus or extra-axial collection. Vascular: There is atherosclerotic calcification of the major vessels at the base of the brain. Skull: Negative Sinuses/Orbits: Clear/normal Other: None ASPECTS ([place] Stroke Program Early CT Score) - Ganglionic level infarction (caudate, lentiform nuclei, internal capsule, insula, M1-M3 cortex): 7 - Supraganglionic infarction (M4-M6 cortex): 3 Total score (0-10 with 10 being normal): 10 Review of the MIP images confirms the above findings CTA NECK FINDINGS Aortic arch: Aortic atherosclerosis. No aneurysm or dissection. Branching pattern is normal. Right carotid system: Common carotid artery widely patent to the bifurcation region. Calcified plaque at the carotid bifurcation and ICA bulb. No stenosis of the ICA however. Left carotid system: Common carotid artery widely patent to the bifurcation. Calcified plaque at the carotid bifurcation and ICA bulb. No stenosis of the ICA however. Vertebral arteries: Both vertebral  arteries widely patent at their origins and through the cervical region to the foramen magnum. Skeleton: Ordinary cervical spondylosis. Other neck: No mass or lymphadenopathy. Upper chest: No active process. Review of the MIP images confirms the above findings CTA HEAD FINDINGS Anterior circulation: Both internal carotid arteries widely patent through the skull base and siphon regions. Ordinary siphon atherosclerotic calcification but no stenosis. The anterior and middle cerebral vessels are widely patent. No large or medium vessel occlusion. 3 mm atherosclerotic aneurysm projecting medially from the supraclinoid ICA on the right. Posterior circulation: Both vertebral arteries widely patent to the basilar. Stenosis of the distal basilar artery of 50%. Flow is present in both superior cerebellar and posterior cerebral arteries. Fetal origin of the left PCA. Venous sinuses: Patent and normal. Anatomic variants: None significant otherwise. Review of the MIP images confirms  the above findings CT Brain Perfusion Findings: ASPECTS: 10 CBF (<30%) Volume: 25mL Perfusion (Tmax>6.0s) volume: 58mL Mismatch Volume: 23mL Infarction Location:None IMPRESSION: No acute infarction by CT or perfusion study. Aortic atherosclerosis. Mild atherosclerotic disease at both carotid bifurcations but no flow limiting stenosis. No intracranial large or medium vessel occlusion. 3 mm atherosclerotic aneurysm of the supraclinoid ICA on the right, not likely clinically significant. 50% distal basilar artery stenosis but with sufficient perfusion of the posterior circulation vessels by CTA. Findings discussed with Dr. Tomi Bamberger at 0320 hours. Electronically Signed   By: Nelson Chimes M.D.   On: 02/29/2020 03:27   CT Code Stroke Cerebral Perfusion with contrast  Result Date: 02/29/2020 CLINICAL DATA:  Left temporal field cut. Left sensory loss. Dizziness. Vertigo. EXAM: CT ANGIOGRAPHY HEAD AND NECK CT PERFUSION BRAIN TECHNIQUE: Multidetector CT imaging  of the head and neck was performed using the standard protocol during bolus administration of intravenous contrast. Multiplanar CT image reconstructions and MIPs were obtained to evaluate the vascular anatomy. Carotid stenosis measurements (when applicable) are obtained utilizing NASCET criteria, using the distal internal carotid diameter as the denominator. Multiphase CT imaging of the brain was performed following IV bolus contrast injection. Subsequent parametric perfusion maps were calculated using RAPID software. CONTRAST:  176mL OMNIPAQUE IOHEXOL 350 MG/ML SOLN COMPARISON:  None. FINDINGS: CT HEAD FINDINGS Brain: Age related generalized atrophy. No sign of acute infarction, mass lesion, hemorrhage, hydrocephalus or extra-axial collection. Vascular: There is atherosclerotic calcification of the major vessels at the base of the brain. Skull: Negative Sinuses/Orbits: Clear/normal Other: None ASPECTS ([place] Stroke Program Early CT Score) - Ganglionic level infarction (caudate, lentiform nuclei, internal capsule, insula, M1-M3 cortex): 7 - Supraganglionic infarction (M4-M6 cortex): 3 Total score (0-10 with 10 being normal): 10 Review of the MIP images confirms the above findings CTA NECK FINDINGS Aortic arch: Aortic atherosclerosis. No aneurysm or dissection. Branching pattern is normal. Right carotid system: Common carotid artery widely patent to the bifurcation region. Calcified plaque at the carotid bifurcation and ICA bulb. No stenosis of the ICA however. Left carotid system: Common carotid artery widely patent to the bifurcation. Calcified plaque at the carotid bifurcation and ICA bulb. No stenosis of the ICA however. Vertebral arteries: Both vertebral arteries widely patent at their origins and through the cervical region to the foramen magnum. Skeleton: Ordinary cervical spondylosis. Other neck: No mass or lymphadenopathy. Upper chest: No active process. Review of the MIP images confirms the above findings  CTA HEAD FINDINGS Anterior circulation: Both internal carotid arteries widely patent through the skull base and siphon regions. Ordinary siphon atherosclerotic calcification but no stenosis. The anterior and middle cerebral vessels are widely patent. No large or medium vessel occlusion. 3 mm atherosclerotic aneurysm projecting medially from the supraclinoid ICA on the right. Posterior circulation: Both vertebral arteries widely patent to the basilar. Stenosis of the distal basilar artery of 50%. Flow is present in both superior cerebellar and posterior cerebral arteries. Fetal origin of the left PCA. Venous sinuses: Patent and normal. Anatomic variants: None significant otherwise. Review of the MIP images confirms the above findings CT Brain Perfusion Findings: ASPECTS: 10 CBF (<30%) Volume: 18mL Perfusion (Tmax>6.0s) volume: 53mL Mismatch Volume: 63mL Infarction Location:None IMPRESSION: No acute infarction by CT or perfusion study. Aortic atherosclerosis. Mild atherosclerotic disease at both carotid bifurcations but no flow limiting stenosis. No intracranial large or medium vessel occlusion. 3 mm atherosclerotic aneurysm of the supraclinoid ICA on the right, not likely clinically significant. 50% distal basilar artery stenosis but  with sufficient perfusion of the posterior circulation vessels by CTA. Findings discussed with Dr. Tomi Bamberger at 0320 hours. Electronically Signed   By: Nelson Chimes M.D.   On: 02/29/2020 03:27    My personal review of EKG: Rhythm normal sinus rhythm   Assessment & Plan:    Active Problems:   TIA (transient ischemic attack)   1. TIA versus stroke-placed under observation, will initiate TIA work-up.  NIHSS score is 2.  CTA head and neck obtained was unremarkable.  Will obtain MRI brain without contrast and echocardiogram in a.m.  Aspirin 325 mg p.o. daily.  Patient not candidate for TPA as per tele neurology as patient was last known normal was several days prior.  Will obtain  hemoglobin A1c, lipid profile.  Start Lipitor 20 mg p.o. daily. 2. ?  UTI-patient has abnormal UA, started on Bactrim DS 1 tablet p.o. twice daily.  Urine culture has been obtained.  Follow urine culture results.   DVT Prophylaxis-   Lovenox   AM Labs Ordered, also please review Full Orders  Family Communication: Admission, patients condition and plan of care including tests being ordered have been discussed with the patient who indicate understanding and agree with the plan and Code Status.  Code Status: Full code  Admission status: Observation  Time spent in minutes : 60 min   Gabrien Mentink S Chanita Boden M.D

## 2020-02-29 NOTE — ED Notes (Signed)
In ct  

## 2020-02-29 NOTE — ED Provider Notes (Signed)
[facility] EMERGENCY DEPARTMENT Provider Note   CSN: [id number] Arrival date & time: 02/29/20  0002   Time seen 12:55 AM  History Chief Complaint  Patient presents with  . Dizziness    numbness (now resolved)    Debra Olsen is a 84 y.o. female.  HPI Patient states over the past week she has been helping her husband work outside and there is a lean to with a low roofline and she has hit the top of her head several times going in and out of that area because the board is so low.  She states at 1030 tonight she had numbness of the left side of her face.  It lasted about 2 hours.  She was able to speak normally and swallow normally.  She states there was no obvious drooping of her face.  She denies any weakness or numbness in her arms and legs however she states her left arm "felt funny and weird".  Patient is right-handed.  She states this is never happened before.  She denies a headache but states her head feels full like it has too much fluid and puts her hand on the top of her head.  She has been having dizzy episodes for the last few weeks which she has had for years.  She states it is intense when she stands up or when she lays down to go to bed her head spins.  She states the dizziness has not changed today.  She denies neck pain but states her neck feels stiff "kind of" for a few days.  She denies being on any blood thinners other than a baby aspirin a day.  She did take 3 baby aspirin tonight after the facial numbness started.  She states she has been told she is diabetic but she is on no medications for it.  She has never had a stroke and denies any family history of stroke.  PCP Glenda Chroman, MD     Past Medical History:  Diagnosis Date  . Arthritis   . Diabetes mellitus without complication ([place])   . Diverticulosis of intestine   . GERD (gastroesophageal reflux disease)     There are no problems to display for this patient.   Past Surgical History:  Procedure Laterality  Date  . ABDOMINAL HYSTERECTOMY       OB History   No obstetric history on file.     Family History  Problem Relation Age of Onset  . Heart failure Father   . Cancer Sister   . Cancer Other     Social History   Tobacco Use  . Smoking status: Never Smoker  . Smokeless tobacco: Never Used  Substance Use Topics  . Alcohol use: No  . Drug use: No  lives at home Lives with spouse  Home Medications Prior to Admission medications   Medication Sig Start Date End Date Taking? Authorizing Provider  acetaminophen (TYLENOL) 500 MG tablet Take 1,000 mg by mouth every 6 (six) hours as needed for mild pain.    [provider]  aspirin EC 81 MG tablet Take 81 mg by mouth daily.    [provider]  cephALEXin (KEFLEX) 500 MG capsule Take 1 capsule (500 mg total) by mouth 4 (four) times daily. For 7 days 03/08/18   Triplett, Tammy, PA-C  diphenhydrAMINE (BENADRYL) 25 MG tablet 25 mg at bedtime.     [provider]  ibuprofen (ADVIL,MOTRIN) 200 MG tablet Take 200 mg by mouth every  6 (six) hours as needed for mild pain or moderate pain.    [provider]    Allergies    Lidocaine and Penicillins  Review of Systems   Review of Systems  All other systems reviewed and are negative.   Physical Exam Updated Vital Signs BP (!) 155/99   Pulse 95   Temp 98.4 F (36.9 C) (Oral)   Resp 17   Ht 5\' 8"  (1.727 m)   Wt 88.5 kg   SpO2 96%   BMI 29.65 kg/m   Physical Exam Vitals and nursing note reviewed.  Constitutional:      General: She is not in acute distress.    Appearance: Normal appearance. She is well-developed. She is not ill-appearing or toxic-appearing.  HENT:     Head: Normocephalic and atraumatic.     Right Ear: External ear normal.     Left Ear: External ear normal.     Nose: Nose normal. No mucosal edema or rhinorrhea.     Mouth/Throat:     Mouth: Mucous membranes are moist.     Dentition: No dental abscesses.     Pharynx: No  oropharyngeal exudate, posterior oropharyngeal erythema or uvula swelling.  Eyes:     Extraocular Movements: Extraocular movements intact.     Conjunctiva/sclera: Conjunctivae normal.     Pupils: Pupils are equal, round, and reactive to light.  Cardiovascular:     Rate and Rhythm: Normal rate and regular rhythm.     Heart sounds: Normal heart sounds. No murmur. No friction rub. No gallop.   Pulmonary:     Effort: Pulmonary effort is normal. No respiratory distress.     Breath sounds: Normal breath sounds. No wheezing, rhonchi or rales.  Chest:     Chest wall: No tenderness or crepitus.  Abdominal:     General: Bowel sounds are normal. There is no distension.     Palpations: Abdomen is soft.     Tenderness: There is no abdominal tenderness. There is no guarding or rebound.  Musculoskeletal:        General: No tenderness. Normal range of motion.     Cervical back: Full passive range of motion without pain, normal range of motion and neck supple.     Comments: Moves all extremities well.   Skin:    General: Skin is warm and dry.     Coloration: Skin is not pale.     Findings: No erythema or rash.  Neurological:     General: No focal deficit present.     Mental Status: She is alert and oriented to person, place, and time.     Cranial Nerves: No cranial nerve deficit.     Comments: Grips are equal, there is no pronator drift, there is no motor weakness in her extremities.  Psychiatric:        Mood and Affect: Mood normal. Mood is not anxious.        Speech: Speech normal.        Behavior: Behavior normal.        Thought Content: Thought content normal.     ED Results / Procedures / Treatments   Labs (all labs ordered are listed, but only abnormal results are displayed) Results for orders placed or performed during the hospital encounter of 02/29/20  Ethanol  Result Value Ref Range   Alcohol, Ethyl (B) <10 <10 mg/dL  Protime-INR  Result Value Ref Range   Prothrombin Time 12.8  11.4 - 15.2 seconds  INR 1.0 0.8 - 1.2  APTT  Result Value Ref Range   aPTT 25 24 - 36 seconds  CBC  Result Value Ref Range   WBC 7.6 4.0 - 10.5 K/uL   RBC 4.35 3.87 - 5.11 MIL/uL   Hemoglobin 13.5 12.0 - 15.0 g/dL   HCT 41.8 36.0 - 46.0 %   MCV 96.1 80.0 - 100.0 fL   MCH 31.0 26.0 - 34.0 pg   MCHC 32.3 30.0 - 36.0 g/dL   RDW 12.6 11.5 - 15.5 %   Platelets 310 150 - 400 K/uL   nRBC 0.0 0.0 - 0.2 %  Differential  Result Value Ref Range   Neutrophils Relative % 52 %   Neutro Abs 3.9 1.7 - 7.7 K/uL   Lymphocytes Relative 29 %   Lymphs Abs 2.2 0.7 - 4.0 K/uL   Monocytes Relative 12 %   Monocytes Absolute 0.9 0.1 - 1.0 K/uL   Eosinophils Relative 5 %   Eosinophils Absolute 0.3 0.0 - 0.5 K/uL   Basophils Relative 2 %   Basophils Absolute 0.2 (H) 0.0 - 0.1 K/uL   Immature Granulocytes 0 %   Abs Immature Granulocytes 0.02 0.00 - 0.07 K/uL  Comprehensive metabolic panel  Result Value Ref Range   Sodium 140 135 - 145 mmol/L   Potassium 4.0 3.5 - 5.1 mmol/L   Chloride 106 98 - 111 mmol/L   CO2 26 22 - 32 mmol/L   Glucose, Bld 115 (H) 70 - 99 mg/dL   BUN 17 8 - 23 mg/dL   Creatinine, Ser 0.98 0.44 - 1.00 mg/dL   Calcium 9.1 8.9 - 10.3 mg/dL   Total Protein 7.6 6.5 - 8.1 g/dL   Albumin 3.7 3.5 - 5.0 g/dL   AST 19 15 - 41 U/L   ALT 12 0 - 44 U/L   Alkaline Phosphatase 69 38 - 126 U/L   Total Bilirubin 0.5 0.3 - 1.2 mg/dL   GFR calc non Af Amer 54 (L) >60 mL/min   GFR calc Af Amer >60 >60 mL/min   Anion gap 8 5 - 15  Urine rapid drug screen (hosp performed)  Result Value Ref Range   Opiates NONE DETECTED NONE DETECTED   Cocaine NONE DETECTED NONE DETECTED   Benzodiazepines NONE DETECTED NONE DETECTED   Amphetamines NONE DETECTED NONE DETECTED   Tetrahydrocannabinol NONE DETECTED NONE DETECTED   Barbiturates NONE DETECTED NONE DETECTED  Urinalysis, Routine w reflex microscopic  Result Value Ref Range   Color, Urine STRAW (A) YELLOW   APPearance HAZY (A) CLEAR   Specific  Gravity, Urine 1.004 (L) 1.005 - 1.030   pH 7.0 5.0 - 8.0   Glucose, UA NEGATIVE NEGATIVE mg/dL   Hgb urine dipstick SMALL (A) NEGATIVE   Bilirubin Urine NEGATIVE NEGATIVE   Ketones, ur NEGATIVE NEGATIVE mg/dL   Protein, ur NEGATIVE NEGATIVE mg/dL   Nitrite NEGATIVE NEGATIVE   Leukocytes,Ua LARGE (A) NEGATIVE   RBC / HPF 21-50 0 - 5 RBC/hpf   WBC, UA >50 (H) 0 - 5 WBC/hpf   Bacteria, UA MANY (A) NONE SEEN   Squamous Epithelial / LPF 0-5 0 - 5   Non Squamous Epithelial 6-10 (A) NONE SEEN   Laboratory interpretation all normal except nonfasting hyperglycemia, possible UTI, urine culture sent    EKG EKG Interpretation  Date/Time:  Sunday February 29 2020 00:46:45 EST Ventricular Rate:  82 PR Interval:    QRS Duration: 95 QT Interval:  378 QTC Calculation: 442 R  Axis:   35 Text Interpretation: Sinus rhythm Low voltage, precordial leads Baseline wander in lead(s) V6 No significant change since last tracing 25 Mar 2016 Confirmed by Rolland Porter [phone]) on 02/29/2020 12:54:53 AM   Radiology CT Code Stroke CTA Head W/WO contrast CT Code Stroke CTA Neck W/WO contrast CT Code Stroke Cerebral Perfusion with contrast  Result Date: 02/29/2020 CLINICAL DATA:  Left temporal field cut. Left sensory loss. Dizziness. Vertigo. EXAM: CT ANGIOGRAPHY HEAD AND NECK CT PERFUSION BRAIN TECHNIQUE: Multidetector CT imaging of the head and neck was performed using the standard protocol during bolus administration of intravenous contrast. Multiplanar CT image reconstructions and MIPs were obtained to evaluate the vascular anatomy. Carotid stenosis measurements (when applicable) are obtained utilizing NASCET criteria, using the distal internal carotid diameter as the denominator. Multiphase CT imaging of the brain was performed following IV bolus contrast injection. Subsequent parametric perfusion maps were calculated using RAPID software. CONTRAST:  159mL OMNIPAQUE IOHEXOL 350 MG/ML SOLN COMPARISON:  None. FINDINGS:  CT HEAD FINDINGS Brain: Age related generalized atrophy. No sign of acute infarction, mass lesion, hemorrhage, hydrocephalus or extra-axial collection. Vascular: There is atherosclerotic calcification of the major vessels at the base of the brain. Skull: Negative Sinuses/Orbits: Clear/normal Other: None ASPECTS ([place] Stroke Program Early CT Score) - Ganglionic level infarction (caudate, lentiform nuclei, internal capsule, insula, M1-M3 cortex): 7 - Supraganglionic infarction (M4-M6 cortex): 3 Total score (0-10 with 10 being normal): 10 Review of the MIP images confirms the above findings CTA NECK FINDINGS Aortic arch: Aortic atherosclerosis. No aneurysm or dissection. Branching pattern is normal. Right carotid system: Common carotid artery widely patent to the bifurcation region. Calcified plaque at the carotid bifurcation and ICA bulb. No stenosis of the ICA however. Left carotid system: Common carotid artery widely patent to the bifurcation. Calcified plaque at the carotid bifurcation and ICA bulb. No stenosis of the ICA however. Vertebral arteries: Both vertebral arteries widely patent at their origins and through the cervical region to the foramen magnum. Skeleton: Ordinary cervical spondylosis. Other neck: No mass or lymphadenopathy. Upper chest: No active process. Review of the MIP images confirms the above findings CTA HEAD FINDINGS Anterior circulation: Both internal carotid arteries widely patent through the skull base and siphon regions. Ordinary siphon atherosclerotic calcification but no stenosis. The anterior and middle cerebral vessels are widely patent. No large or medium vessel occlusion. 3 mm atherosclerotic aneurysm projecting medially from the supraclinoid ICA on the right. Posterior circulation: Both vertebral arteries widely patent to the basilar. Stenosis of the distal basilar artery of 50%. Flow is present in both superior cerebellar and posterior cerebral arteries. Fetal origin of the left  PCA. Venous sinuses: Patent and normal. Anatomic variants: None significant otherwise. Review of the MIP images confirms the above findings CT Brain Perfusion Findings: ASPECTS: 10 CBF (<30%) Volume: 5mL Perfusion (Tmax>6.0s) volume: 54mL Mismatch Volume: 62mL Infarction Location:None IMPRESSION: No acute infarction by CT or perfusion study. Aortic atherosclerosis. Mild atherosclerotic disease at both carotid bifurcations but no flow limiting stenosis. No intracranial large or medium vessel occlusion. 3 mm atherosclerotic aneurysm of the supraclinoid ICA on the right, not likely clinically significant. 50% distal basilar artery stenosis but with sufficient perfusion of the posterior circulation vessels by CTA. Findings discussed with Dr. Tomi Bamberger at 0320 hours. Electronically Signed   By: Nelson Chimes M.D.   On: 02/29/2020 03:27    Procedures .Critical Care Performed by: Rolland Porter, MD Authorized by: Rolland Porter, MD   Critical care provider statement:  Critical care time (minutes):  38   Critical care was necessary to treat or prevent imminent or life-threatening deterioration of the following conditions:  CNS failure or compromise   Critical care was time spent personally by me on the following activities:  Discussions with consultants, examination of patient, obtaining history from patient or surrogate, ordering and review of laboratory studies, ordering and review of radiographic studies and re-evaluation of patient's condition   (including critical care time)  Medications Ordered in ED Medications  iohexol (OMNIPAQUE) 350 MG/ML injection 40 mL (has no administration in time range)  sulfamethoxazole-trimethoprim (BACTRIM DS) 800-160 MG per tablet 1 tablet (has no administration in time range)  iohexol (OMNIPAQUE) 350 MG/ML injection 115 mL (115 mLs Intravenous Contrast Given 02/29/20 0246)    ED Course  I have reviewed the triage vital signs and the nursing notes.  Pertinent labs & imaging  results that were available during my care of the patient were reviewed by me and considered in my medical decision making (see chart for details).  Patient appears to have had a TIA with neurological symptoms lasting about 2 hours.  CT head and perfusion study was done.  Laboratory testing was done.  Patient well most likely need to be admitted for further TIA evaluation.  I will have teleneurology also evaluate patient.    MDM Rules/Calculators/A&P                      Head CT and perfusion study were ordered.  Routine laboratory testing was done.  Teleneurology consult was ordered.  1:46 AM Ronalee Belts, teleneurology nurse called to discuss consult, he is going to have the teleneurologist to consult now.  02:10 AM Dr Carron Brazen, teleneurology, has assessed patient.  She states the patient still has some numbness in her left face and some visual field loss in her left eye.  The patient reported to her that her symptoms have been coming and going for the past few days.  She has added CTA head and neck to the CT perfusion study and regular CT.  3:16 AM radiologist, Dr. Maree Erie called and states he has reviewed her head CT and her CTAs, he states there is no medium or large vessel abnormality seen, there is no flow restriction stenosis seen, he is waiting for her diffusion scan to be sent over to him.  I have added the additional blood work recommended by the teleneurologist.  3:38 AM the teleneurologist called me back.  States there was a 50% stenosis in the posterior circulation but nothing that needed urgent intervention tonight.  We discussed that patient already took aspirin prior to coming to the ED and she states she does not need another dose tonight.  She did advise however to start patient on a statin drug today.  I will talk to the hospitalist about admission.  I have talked to the patient about admission.  She is reluctant to leave her husband home alone because he is not able to get around well  however she understands the importance of being admitted.  4:12 AM Dr. Darrick Meigs, hospitalist will admit.  Patient was started on Septra DS for possible UTI.  Final Clinical Impression(s) / ED Diagnoses Final diagnoses:  TIA (transient ischemic attack)  Urinary tract infection without hematuria, site unspecified    Rx / DC Orders  Plan admission  Rolland Porter, MD, Barbette Or, MD 02/29/20 [phone]

## 2020-02-29 NOTE — Consult Note (Addendum)
[facility] Tele[facility]urology Consult Services  Stat Consult  Date of Service:   02/29/2020 01:45:23  Impression:     .  R20.2 - Paresthesia of skin  Comments/Sign-Out: Patient is an 84 yo female with a PMH of DM II, diverticulitis, h/o vertigo, cataracts GERD who p/w left facial numbness and shakiness. Also, noted to have increased left eye blurred vision and dizziness. LNK is unclear, several days or greater prior per patient. On exam she has diminished sensation in the left face and arm, mildly and possible left temporal vision loss compared to the right. NIHSS of 2. CTH, CTP, CTA head and neck are penidng. Given her LNK of several days prior Alteplase is not recommended. However, given crescendo vs stuttering symptoms will proceed with CTA head and neck to exclude critical stenosis and dissection. Possible subacute stroke. In addition, possible underlying parkinsonism.  Addendum: CTP/CTA head and neck as noted below. No acute perfusion changes, LVO or critical stenosis. Pt took 3 baby ASA prior to arrival. Rec addition of statin. MRI brain to be obtained once admitted.   No acute infarction by CT or perfusion study. Aortic atherosclerosis. Mild atherosclerotic disease at both carotid bifurcations but no flow limiting stenosis. No intracranial large or medium vessel occlusion. 3 mm atherosclerotic aneurysm of the supraclinoid ICA on the right, not likely clinically significant. 50% distal basilar artery stenosis but with sufficient perfusion of the posterior circulation vessels by CTA.  Metrics: [facility] Notification Time: 02/29/2020 01:43:33 Stamp Time: 02/29/2020 01:45:23 Callback Response Time: 02/29/2020 01:46:18  Our recommendations are outlined below.  Recommendations:     .  ASA 325mg  in ED, once [facility] reviewed.  Imaging Studies:     .  MRI Head Without Contrast     .  Echocardiogram - Transthoracic Echocardiogram  Therapies:     .  Physical  Therapy, Occupational Therapy, Speech Therapy Assessment When Applicable  Other WorkUp:     .  Check B12 level     .  Check TSH     .  Check Urinalysis  Disposition: Neurology Follow Up Recommended  Sign Out:     .  Discussed with Emergency Department Provider  ----------------------------------------------------------------------------------------------------  Chief Complaint: left facial numbness  History of Present Illness: Patient is a 84 year old Female.  Patient is an 84 yo female with a PMH of DM II, diverticulitis, h/o vertigo, cataracts GERD who p/w left facial numbness and shakiness. Also, noted to have increased left eye blurred vision and dizziness. LNK is unclear, several days or greater prior per patient. She reports that for several weeks she has had increasing dizziness. This dizziness is different from baseline vertigo, which responds to meclizine. Meclizine has not helped. She has also noticed for several days drooping of her left eye and increased blurred vision. She has had mild blurred vision in her left eye since her catract surgery 3 years prior, however has been having more trouble seeing from the left eye for several days. Today about 10:30PM she noticed left facial numbness and shakiness. She feels that the numbness has resolved subjectively (still noted clinically on exam). She has noticed a jaw tremor recently, but not shakiness in her arms. No headache, double vision, limb weakness. No known h/o stroke or TIA. She takes ASA 81mg  daily.   Past Medical History:     . Diabetes Mellitus  Anticoagulant use:  No  Antiplatelet use: ASA    Examination: BP(165/86), Pulse(74), Blood Glucose(Reviewed) 1A: Level of Consciousness - Alert; keenly  responsive + 0 1B: Ask Month and Age - Both Questions Right + 0 1C: Blink Eyes & Squeeze Hands - Performs Both Tasks + 0 2: Test Horizontal Extraocular Movements - Normal + 0 3: Test Visual Fields - Partial Hemianopia +  1 4: Test Facial Palsy (Use Grimace if Obtunded) - Normal symmetry + 0 5A: Test Left Arm Motor Drift - No Drift for 10 Seconds + 0 5B: Test Right Arm Motor Drift - No Drift for 10 Seconds + 0 6A: Test Left Leg Motor Drift - No Drift for 5 Seconds + 0 6B: Test Right Leg Motor Drift - No Drift for 5 Seconds + 0 7: Test Limb Ataxia (FNF/Heel-Shin) - No Ataxia + 0 8: Test Sensation - Mild-Moderate Loss: Less Sharp/More Dull + 1 9: Test Language/Aphasia - Normal; No aphasia + 0 10: Test Dysarthria - Normal + 0 11: Test Extinction/Inattention - No abnormality + 0  NIHSS Score: 2   Patient/Family was informed the Neurology Consult would occur via TeleHealth consult by way of interactive audio and video telecommunications and consented to receiving care in this manner.  Due to the immediate potential for life-threatening deterioration due to underlying acute neurologic illness, I spent 25 minutes providing critical care. This time includes time for face to face visit via telemedicine, review of medical records, imaging studies and discussion of findings with providers, the patient and/or family.   Dr Ruffin Frederick   [facility] [phone]  Case [id number]

## 2020-02-29 NOTE — ED Triage Notes (Signed)
Pt reports dizziness for the past couple of weeks with hx of vertigo, pt says she has been taking her meclozine for this "without relief this time."  PT reports left sided facial numbness and left arm weakness that last approx 15 minutes. Pt reports all symptoms have subsided at this time.

## 2020-03-01 ENCOUNTER — Inpatient Hospital Stay (HOSPITAL_COMMUNITY): Payer: Medicare Other

## 2020-03-01 DIAGNOSIS — N390 Urinary tract infection, site not specified: Secondary | ICD-10-CM | POA: Diagnosis not present

## 2020-03-01 DIAGNOSIS — E538 Deficiency of other specified B group vitamins: Secondary | ICD-10-CM | POA: Diagnosis not present

## 2020-03-01 DIAGNOSIS — R202 Paresthesia of skin: Secondary | ICD-10-CM

## 2020-03-01 DIAGNOSIS — E785 Hyperlipidemia, unspecified: Secondary | ICD-10-CM | POA: Diagnosis not present

## 2020-03-01 DIAGNOSIS — G459 Transient cerebral ischemic attack, unspecified: Secondary | ICD-10-CM | POA: Diagnosis not present

## 2020-03-01 MED ORDER — ATORVASTATIN CALCIUM 80 MG PO TABS: 80.0000 mg | ORAL_TABLET | Freq: Every day | ORAL | 1 refills | Status: DC

## 2020-03-01 MED ORDER — ASPIRIN EC 81 MG PO TBEC: 81.0000 mg | DELAYED_RELEASE_TABLET | Freq: Every day | ORAL | 0 refills | Status: DC

## 2020-03-01 MED ORDER — CYANOCOBALAMIN 1000 MCG PO TABS: 1000.0000 ug | ORAL_TABLET | Freq: Every day | ORAL | 0 refills | Status: DC

## 2020-03-01 MED ORDER — SULFAMETHOXAZOLE-TRIMETHOPRIM 800-160 MG PO TABS: 1.0000 | ORAL_TABLET | Freq: Two times a day (BID) | ORAL | 0 refills | Status: AC

## 2020-03-01 MED ORDER — CLOPIDOGREL BISULFATE 75 MG PO TABS: 75.0000 mg | ORAL_TABLET | Freq: Every day | ORAL | 1 refills | Status: DC

## 2020-03-01 NOTE — Progress Notes (Signed)
PT Cancellation Note  Patient Details Name: Debra Olsen MRN: [id number] DOB: 10-Nov-1936   Cancelled Treatment:    Reason Eval/Treat Not Completed: PT screened, no needs identified, will sign off. Per OT evaluation, the pt is at her baseline level of function and mobility. I introduced myself to the pt and described the role of acute PT services, and she also agreed she is at her baseline and does not have a need currently for our services. Thank you for the consult, please feel free to re-consult if change in status.   Hardie Pulley, DPT   Acute Rehabilitation Department Pager #: [phone]   Otho Bellows 03/01/2020, 12:05 PM

## 2020-03-01 NOTE — Progress Notes (Signed)
Patient being discharged to home with self care. Education and information provided to patient. IV removed. CCMD notified. Patient leaving unit via wheelchair. All belongings with patient.

## 2020-03-01 NOTE — TOC Transition Note (Signed)
Transition of Care [facility]) - CM/SW Discharge Note   Patient Details  Name: Debra Olsen MRN: [id number] Date of Birth: 1936/12/03  Transition of Care [facility]) CM/SW Contact:  Pollie Friar, RN Phone Number: 03/01/2020, 3:55 PM   Clinical Narrative:    Pt discharging home with self care. No f/u per PT/OT and no DME needs. Pt has hospital f/u and transportation home.    Final next level of care: Home/Self Care Barriers to Discharge: No Barriers Identified   Patient Goals and CMS Choice        Discharge Placement                       Discharge Plan and Services                                     Social Determinants of Health (SDOH) Interventions     Readmission Risk Interventions No flowsheet data found.

## 2020-03-01 NOTE — Consult Note (Addendum)
Stroke Neurology Consultation Note  Consult Requested by: [facility], seen by teleneurology at [facility] 02/29/2020  Reason for Consult: decreased sensation L face and am, L temporal field cut   Consult Date:  03/01/20  The history was obtained from the patient and chart.  During history and examination, all items were able to obtain unless otherwise noted.  History of Present Illness:  Debra Olsen is an 84 y.o. Caucasian female with PMH of DB, diverticulitis, vertigo, cataracts, GERD who presented to [facility] ED on 3/6 night reporting L facial numbness, shakiness, OS blurred vision for several days, LKW is unknown. They found her to have mildly decreased sensation in L face and arm and possible L temporal field cut. NIHSS 2. She is on aspirin. Not a tPA candidate given outside the window. She was transferred to [facility] for further stroke work up. Today, pt sitting in chair stated that all her symptoms are gone. Her left face and arm funny feeling has resolved. No visual field cut seen. MRI negative for stroke.     Past Medical History:  Diagnosis Date  . Arthritis   . Diabetes mellitus without complication ([place])   . Diverticulosis of intestine   . GERD (gastroesophageal reflux disease)      Past Surgical History:  Procedure Laterality Date  . ABDOMINAL HYSTERECTOMY      Family History  Problem Relation Age of Onset  . Heart failure Father   . Cancer Sister   . Cancer Other     Social History:  reports that she has never smoked. She has never used smokeless tobacco. She reports that she does not drink alcohol or use drugs.  Review of Systems: A full ROS was attempted today and was able to be performed.  Systems assessed include - Constitutional, Eyes, HENT, Respiratory, Cardiovascular, Gastrointestinal, Genitourinary, Integument/breast, Hematologic/lymphatic, Musculoskeletal, Neurological, Behavioral/Psych, Endocrine, Allergic/Immunologic - with pertinent  responses as per HPI.   Medications: I have reviewed the patient's current medications.  CBC:  Recent Labs  Lab 02/29/20 0151  WBC 7.6  NEUTROABS 3.9  HGB 13.5  HCT 41.8  MCV 96.1  PLT 99991111   Basic Metabolic Panel:  Recent Labs  Lab 02/29/20 0151  NA 140  K 4.0  CL 106  CO2 26  GLUCOSE 115*  BUN 17  CREATININE 0.98  CALCIUM 9.1   Liver Function Tests: Recent Labs  Lab 02/29/20 0151  AST 19  ALT 12  ALKPHOS 69  BILITOT 0.5  PROT 7.6  ALBUMIN 3.7   Coagulation Studies:  Recent Labs    02/29/20 0151  LABPROT 12.8  INR 1.0   Urinalysis:  Recent Labs  Lab 02/29/20 0155  COLORURINE STRAW*  LABSPEC 1.004*  PHURINE 7.0  GLUCOSEU NEGATIVE  HGBUR SMALL*  BILIRUBINUR NEGATIVE  KETONESUR NEGATIVE  PROTEINUR NEGATIVE  NITRITE NEGATIVE  LEUKOCYTESUR LARGE*   Microbiology:  Results for orders placed or performed during the hospital encounter of 02/29/20  Urine culture     Status: Abnormal (Preliminary result)   Collection Time: 02/29/20  1:55 AM   Specimen: Urine, Clean Catch  Result Value Ref Range Status   Specimen Description   Final    URINE, CLEAN CATCH Performed at [facility], [address], [place], [place] [address]    Special Requests   Final    NONE Performed at [facility], [address], [place], [place] [address]    Culture (A)  Final    >=100,000 COLONIES/mL ESCHERICHIA COLI  CULTURE REINCUBATED FOR BETTER GROWTH Performed at [facility], [place] [address] [place], [place] [address]    Report Status PENDING  Incomplete  SARS CORONAVIRUS 2 (TAT 6-24 HRS) Nasopharyngeal Nasopharyngeal Swab     Status: None   Collection Time: 02/29/20  4:24 AM   Specimen: Nasopharyngeal Swab  Result Value Ref Range Status   SARS Coronavirus 2 NEGATIVE NEGATIVE Final    Comment: (NOTE) SARS-CoV-2 target nucleic acids are NOT DETECTED. The SARS-CoV-2 RNA is generally detectable in upper and lower respiratory specimens during the acute phase of  infection. Negative results do not preclude SARS-CoV-2 infection, do not rule out co-infections with other pathogens, and should not be used as the sole basis for treatment or other patient management decisions. Negative results must be combined with clinical observations, patient history, and epidemiological information. The expected result is Negative. Fact Sheet for Patients: SugarRoll.be Fact Sheet for Healthcare Providers: https://www.woods-mathews.com/ This test is not yet approved or cleared by the [place] FDA and  has been authorized for detection and/or diagnosis of SARS-CoV-2 by FDA under an Emergency Use Authorization (EUA). This EUA will remain  in effect (meaning this test can be used) for the duration of the COVID-19 declaration under Section 56 4(b)(1) of the Act, 21 U.S.C. section 360bbb-3(b)(1), unless the authorization is terminated or revoked sooner. Performed at [facility], [place] [address] [place], [place] [address]    Lipid Panel:     Component Value Date/Time   CHOL 277 (H) 02/29/2020 0415   TRIG 111 02/29/2020 0415   HDL 58 02/29/2020 0415   CHOLHDL 4.8 02/29/2020 0415   VLDL 22 02/29/2020 0415   LDLCALC 197 (H) 02/29/2020 0415   HgbA1c:  Lab Results  Component Value Date   HGBA1C 6.0 (H) 02/29/2020   Urine Drug Screen:     Component Value Date/Time   LABOPIA NONE DETECTED 02/29/2020 0155   COCAINSCRNUR NONE DETECTED 02/29/2020 0155   LABBENZ NONE DETECTED 02/29/2020 0155   AMPHETMU NONE DETECTED 02/29/2020 0155   THCU NONE DETECTED 02/29/2020 0155   LABBARB NONE DETECTED 02/29/2020 0155    Alcohol Level:  Recent Labs  Lab 02/29/20 0151  ETH <10    CT Code Stroke CTA Head W/WO contrast  Result Date: 02/29/2020 CLINICAL DATA:  Left temporal field cut. Left sensory loss. Dizziness. Vertigo. EXAM: CT ANGIOGRAPHY HEAD AND NECK CT PERFUSION BRAIN TECHNIQUE: Multidetector CT imaging of the  head and neck was performed using the standard protocol during bolus administration of intravenous contrast. Multiplanar CT image reconstructions and MIPs were obtained to evaluate the vascular anatomy. Carotid stenosis measurements (when applicable) are obtained utilizing NASCET criteria, using the distal internal carotid diameter as the denominator. Multiphase CT imaging of the brain was performed following IV bolus contrast injection. Subsequent parametric perfusion maps were calculated using RAPID software. CONTRAST:  158mL OMNIPAQUE IOHEXOL 350 MG/ML SOLN COMPARISON:  None. FINDINGS: CT HEAD FINDINGS Brain: Age related generalized atrophy. No sign of acute infarction, mass lesion, hemorrhage, hydrocephalus or extra-axial collection. Vascular: There is atherosclerotic calcification of the major vessels at the base of the brain. Skull: Negative Sinuses/Orbits: Clear/normal Other: None ASPECTS ([place] Stroke Program Early CT Score) - Ganglionic level infarction (caudate, lentiform nuclei, internal capsule, insula, M1-M3 cortex): 7 - Supraganglionic infarction (M4-M6 cortex): 3 Total score (0-10 with 10 being normal): 10 Review of the MIP images confirms the above findings CTA NECK FINDINGS Aortic arch: Aortic atherosclerosis. No aneurysm or dissection. Branching pattern is  normal. Right carotid system: Common carotid artery widely patent to the bifurcation region. Calcified plaque at the carotid bifurcation and ICA bulb. No stenosis of the ICA however. Left carotid system: Common carotid artery widely patent to the bifurcation. Calcified plaque at the carotid bifurcation and ICA bulb. No stenosis of the ICA however. Vertebral arteries: Both vertebral arteries widely patent at their origins and through the cervical region to the foramen magnum. Skeleton: Ordinary cervical spondylosis. Other neck: No mass or lymphadenopathy. Upper chest: No active process. Review of the MIP images confirms the above findings CTA  HEAD FINDINGS Anterior circulation: Both internal carotid arteries widely patent through the skull base and siphon regions. Ordinary siphon atherosclerotic calcification but no stenosis. The anterior and middle cerebral vessels are widely patent. No large or medium vessel occlusion. 3 mm atherosclerotic aneurysm projecting medially from the supraclinoid ICA on the right. Posterior circulation: Both vertebral arteries widely patent to the basilar. Stenosis of the distal basilar artery of 50%. Flow is present in both superior cerebellar and posterior cerebral arteries. Fetal origin of the left PCA. Venous sinuses: Patent and normal. Anatomic variants: None significant otherwise. Review of the MIP images confirms the above findings CT Brain Perfusion Findings: ASPECTS: 10 CBF (<30%) Volume: 16mL Perfusion (Tmax>6.0s) volume: 67mL Mismatch Volume: 60mL Infarction Location:None IMPRESSION: No acute infarction by CT or perfusion study. Aortic atherosclerosis. Mild atherosclerotic disease at both carotid bifurcations but no flow limiting stenosis. No intracranial large or medium vessel occlusion. 3 mm atherosclerotic aneurysm of the supraclinoid ICA on the right, not likely clinically significant. 50% distal basilar artery stenosis but with sufficient perfusion of the posterior circulation vessels by CTA. Findings discussed with Dr. Tomi Bamberger at 0320 hours. Electronically Signed   By: Nelson Chimes M.D.   On: 02/29/2020 03:27   DG Chest 2 View  Result Date: 02/29/2020 CLINICAL DATA:  TIA. EXAM: CHEST - 2 VIEW COMPARISON:  Chest radiograph 03/25/2016 FINDINGS: Stable cardiomediastinal contours. Aortic arch calcification noted. The lungs are clear. No pneumothorax or significant pleural effusion. No acute finding in the visualized skeleton. IMPRESSION: No acute cardiopulmonary process. Aortic atherosclerosis. Electronically Signed   By: Audie Pinto M.D.   On: 02/29/2020 13:43   CT Code Stroke CTA Neck W/WO  contrast  Result Date: 02/29/2020 CLINICAL DATA:  Left temporal field cut. Left sensory loss. Dizziness. Vertigo. EXAM: CT ANGIOGRAPHY HEAD AND NECK CT PERFUSION BRAIN TECHNIQUE: Multidetector CT imaging of the head and neck was performed using the standard protocol during bolus administration of intravenous contrast. Multiplanar CT image reconstructions and MIPs were obtained to evaluate the vascular anatomy. Carotid stenosis measurements (when applicable) are obtained utilizing NASCET criteria, using the distal internal carotid diameter as the denominator. Multiphase CT imaging of the brain was performed following IV bolus contrast injection. Subsequent parametric perfusion maps were calculated using RAPID software. CONTRAST:  169mL OMNIPAQUE IOHEXOL 350 MG/ML SOLN COMPARISON:  None. FINDINGS: CT HEAD FINDINGS Brain: Age related generalized atrophy. No sign of acute infarction, mass lesion, hemorrhage, hydrocephalus or extra-axial collection. Vascular: There is atherosclerotic calcification of the major vessels at the base of the brain. Skull: Negative Sinuses/Orbits: Clear/normal Other: None ASPECTS ([place] Stroke Program Early CT Score) - Ganglionic level infarction (caudate, lentiform nuclei, internal capsule, insula, M1-M3 cortex): 7 - Supraganglionic infarction (M4-M6 cortex): 3 Total score (0-10 with 10 being normal): 10 Review of the MIP images confirms the above findings CTA NECK FINDINGS Aortic arch: Aortic atherosclerosis. No aneurysm or dissection. Branching pattern is normal. Right carotid  system: Common carotid artery widely patent to the bifurcation region. Calcified plaque at the carotid bifurcation and ICA bulb. No stenosis of the ICA however. Left carotid system: Common carotid artery widely patent to the bifurcation. Calcified plaque at the carotid bifurcation and ICA bulb. No stenosis of the ICA however. Vertebral arteries: Both vertebral arteries widely patent at their origins and through the  cervical region to the foramen magnum. Skeleton: Ordinary cervical spondylosis. Other neck: No mass or lymphadenopathy. Upper chest: No active process. Review of the MIP images confirms the above findings CTA HEAD FINDINGS Anterior circulation: Both internal carotid arteries widely patent through the skull base and siphon regions. Ordinary siphon atherosclerotic calcification but no stenosis. The anterior and middle cerebral vessels are widely patent. No large or medium vessel occlusion. 3 mm atherosclerotic aneurysm projecting medially from the supraclinoid ICA on the right. Posterior circulation: Both vertebral arteries widely patent to the basilar. Stenosis of the distal basilar artery of 50%. Flow is present in both superior cerebellar and posterior cerebral arteries. Fetal origin of the left PCA. Venous sinuses: Patent and normal. Anatomic variants: None significant otherwise. Review of the MIP images confirms the above findings CT Brain Perfusion Findings: ASPECTS: 10 CBF (<30%) Volume: 63mL Perfusion (Tmax>6.0s) volume: 81mL Mismatch Volume: 52mL Infarction Location:None IMPRESSION: No acute infarction by CT or perfusion study. Aortic atherosclerosis. Mild atherosclerotic disease at both carotid bifurcations but no flow limiting stenosis. No intracranial large or medium vessel occlusion. 3 mm atherosclerotic aneurysm of the supraclinoid ICA on the right, not likely clinically significant. 50% distal basilar artery stenosis but with sufficient perfusion of the posterior circulation vessels by CTA. Findings discussed with Dr. Tomi Bamberger at 0320 hours. Electronically Signed   By: Nelson Chimes M.D.   On: 02/29/2020 03:27   MR BRAIN WO CONTRAST  Result Date: 03/01/2020 CLINICAL DATA:  Initial evaluation for focal neural deficit, stroke suspected. EXAM: MRI HEAD WITHOUT CONTRAST TECHNIQUE: Multiplanar, multiecho pulse sequences of the brain and surrounding structures were obtained without intravenous contrast.  COMPARISON:  Prior CTs from earlier the same day. FINDINGS: Brain: Generalized age-related cerebral atrophy. Mild T2/FLAIR hyperintensity seen involving the periventricular white matter most consistent with chronic small vessel ischemic disease, felt to be within normal limits for age. No abnormal foci of restricted diffusion to suggest acute or subacute ischemia. Gray-white matter differentiation maintained. No encephalomalacia to suggest chronic cortical infarction. No foci of susceptibility artifact to suggest acute or chronic intracranial hemorrhage. No mass lesion, midline shift or mass effect. No hydrocephalus or extra-axial fluid collection. Pituitary gland suprasellar region normal. Midline structures intact. Vascular: Major intracranial vascular flow voids are maintained. Skull and upper cervical spine: Craniocervical junction within normal limits. Bone marrow signal intensity normal. No scalp soft tissue abnormality. Sinuses/Orbits: Patient status post bilateral ocular lens replacement. Paranasal sinuses are largely clear. No mastoid effusion. Inner ear structures grossly normal. Other: None. IMPRESSION: Normal brain MRI for age. No acute intracranial abnormality identified. Electronically Signed   By: Jeannine Boga M.D.   On: 03/01/2020 00:33   CT Code Stroke Cerebral Perfusion with contrast  Result Date: 02/29/2020 CLINICAL DATA:  Left temporal field cut. Left sensory loss. Dizziness. Vertigo. EXAM: CT ANGIOGRAPHY HEAD AND NECK CT PERFUSION BRAIN TECHNIQUE: Multidetector CT imaging of the head and neck was performed using the standard protocol during bolus administration of intravenous contrast. Multiplanar CT image reconstructions and MIPs were obtained to evaluate the vascular anatomy. Carotid stenosis measurements (when applicable) are obtained utilizing NASCET criteria, using the  distal internal carotid diameter as the denominator. Multiphase CT imaging of the brain was performed following  IV bolus contrast injection. Subsequent parametric perfusion maps were calculated using RAPID software. CONTRAST:  129mL OMNIPAQUE IOHEXOL 350 MG/ML SOLN COMPARISON:  None. FINDINGS: CT HEAD FINDINGS Brain: Age related generalized atrophy. No sign of acute infarction, mass lesion, hemorrhage, hydrocephalus or extra-axial collection. Vascular: There is atherosclerotic calcification of the major vessels at the base of the brain. Skull: Negative Sinuses/Orbits: Clear/normal Other: None ASPECTS ([place] Stroke Program Early CT Score) - Ganglionic level infarction (caudate, lentiform nuclei, internal capsule, insula, M1-M3 cortex): 7 - Supraganglionic infarction (M4-M6 cortex): 3 Total score (0-10 with 10 being normal): 10 Review of the MIP images confirms the above findings CTA NECK FINDINGS Aortic arch: Aortic atherosclerosis. No aneurysm or dissection. Branching pattern is normal. Right carotid system: Common carotid artery widely patent to the bifurcation region. Calcified plaque at the carotid bifurcation and ICA bulb. No stenosis of the ICA however. Left carotid system: Common carotid artery widely patent to the bifurcation. Calcified plaque at the carotid bifurcation and ICA bulb. No stenosis of the ICA however. Vertebral arteries: Both vertebral arteries widely patent at their origins and through the cervical region to the foramen magnum. Skeleton: Ordinary cervical spondylosis. Other neck: No mass or lymphadenopathy. Upper chest: No active process. Review of the MIP images confirms the above findings CTA HEAD FINDINGS Anterior circulation: Both internal carotid arteries widely patent through the skull base and siphon regions. Ordinary siphon atherosclerotic calcification but no stenosis. The anterior and middle cerebral vessels are widely patent. No large or medium vessel occlusion. 3 mm atherosclerotic aneurysm projecting medially from the supraclinoid ICA on the right. Posterior circulation: Both vertebral  arteries widely patent to the basilar. Stenosis of the distal basilar artery of 50%. Flow is present in both superior cerebellar and posterior cerebral arteries. Fetal origin of the left PCA. Venous sinuses: Patent and normal. Anatomic variants: None significant otherwise. Review of the MIP images confirms the above findings CT Brain Perfusion Findings: ASPECTS: 10 CBF (<30%) Volume: 3mL Perfusion (Tmax>6.0s) volume: 72mL Mismatch Volume: 15mL Infarction Location:None IMPRESSION: No acute infarction by CT or perfusion study. Aortic atherosclerosis. Mild atherosclerotic disease at both carotid bifurcations but no flow limiting stenosis. No intracranial large or medium vessel occlusion. 3 mm atherosclerotic aneurysm of the supraclinoid ICA on the right, not likely clinically significant. 50% distal basilar artery stenosis but with sufficient perfusion of the posterior circulation vessels by CTA. Findings discussed with Dr. Tomi Bamberger at 0320 hours. Electronically Signed   By: Nelson Chimes M.D.   On: 02/29/2020 03:27   EEG adult  Result Date: 03/01/2020 Lora Havens, MD     03/01/2020 10:52 AM Patient Name: Lachelle Sprowl MRN: [id number] Epilepsy Attending: Lora Havens Referring Physician/Provider: Dr. Rosalin Hawking Date: 03/01/2020 Duration: 24.55 minutes Patient history: 84 year old female who presented with left-sided facial numbness.  MRI brain did not show any acute stroke.  EEG to evaluate for seizures. Level of alertness: Awake AEDs during EEG study: None Technical aspects: This EEG study was done with scalp electrodes positioned according to the 10-20 International system of electrode placement. Electrical activity was acquired at a sampling rate of 500Hz  and reviewed with a high frequency filter of 70Hz  and a low frequency filter of 1Hz . EEG data were recorded continuously and digitally stored. Description: The posterior dominant rhythm consists of 9-10 Hz activity of moderate voltage (25-35 uV) seen  predominantly in posterior head regions, symmetric and reactive  to eye opening and eye closing.  Physiologic photic driving was not seen during photic stimulation.  Hyperventilation was not performed.      IMPRESSION: This study is within normal limits. No seizures or epileptiform discharges were seen throughout the recording. Lora Havens   ECHOCARDIOGRAM COMPLETE  Result Date: 02/29/2020    ECHOCARDIOGRAM REPORT   Patient Name:   JUDETH PRISBREY Date of Exam: 02/29/2020 Medical Rec #:  [id number]     Height:       68.0 in Accession #:    [id number]    Weight:       195.0 lb Date of Birth:  [birth year]     BSA:          2.022 m Patient Age:    24 years      BP:           143/88 mmHg Patient Gender: F             HR:           80 bpm. Exam Location:  [facility] Procedure: 2D Echo Indications:    TIA 435.9 / G45.9  History:        Patient has no prior history of Echocardiogram examinations.                 TIA; Risk Factors:Non-Smoker and Diabetes. GERD.  Sonographer:    Leavy Cella RDCS (AE) Referring Phys: [place]  1. Normal LV systolic function; mild LVH; grade 1 diastolic dysfunction; trace AI.  2. Left ventricular ejection fraction, by estimation, is 60 to 65%. The left ventricle has normal function. The left ventricle has no regional wall motion abnormalities. There is mild left ventricular hypertrophy. Left ventricular diastolic parameters are consistent with Grade I diastolic dysfunction (impaired relaxation).  3. Right ventricular systolic function is normal. The right ventricular size is normal. There is normal pulmonary artery systolic pressure.  4. The mitral valve is normal in structure and function. Trivial mitral valve regurgitation. No evidence of mitral stenosis.  5. The aortic valve is normal in structure and function. Aortic valve regurgitation is trivial. No aortic stenosis is present.  6. The inferior vena cava is normal in size with greater than 50% respiratory  variability, suggesting right atrial pressure of 3 mmHg. FINDINGS  Left Ventricle: Left ventricular ejection fraction, by estimation, is 60 to 65%. The left ventricle has normal function. The left ventricle has no regional wall motion abnormalities. The left ventricular internal cavity size was normal in size. There is  mild left ventricular hypertrophy. Left ventricular diastolic parameters are consistent with Grade I diastolic dysfunction (impaired relaxation). Right Ventricle: The right ventricular size is normal. Right ventricular systolic function is normal. There is normal pulmonary artery systolic pressure. The tricuspid regurgitant velocity is 1.87 m/s, and with an assumed right atrial pressure of 10 mmHg, the estimated right ventricular systolic pressure is 0000000 mmHg. Left Atrium: Left atrial size was normal in size. Right Atrium: Right atrial size was normal in size. Pericardium: There is no evidence of pericardial effusion. Mitral Valve: The mitral valve is normal in structure and function. Normal mobility of the mitral valve leaflets. Trivial mitral valve regurgitation. No evidence of mitral valve stenosis. Tricuspid Valve: The tricuspid valve is normal in structure. Tricuspid valve regurgitation is trivial. No evidence of tricuspid stenosis. Aortic Valve: The aortic valve is normal in structure and function. Aortic valve regurgitation is trivial. No aortic stenosis is present. Pulmonic Valve:  The pulmonic valve was normal in structure. Pulmonic valve regurgitation is trivial. No evidence of pulmonic stenosis. Aorta: The aortic root is normal in size and structure. Venous: The inferior vena cava is normal in size with greater than 50% respiratory variability, suggesting right atrial pressure of 3 mmHg. IAS/Shunts: No atrial level shunt detected by color flow Doppler. Additional Comments: Normal LV systolic function; mild LVH; grade 1 diastolic dysfunction; trace AI.  LEFT VENTRICLE PLAX 2D LVIDd:          3.66 cm  Diastology LVIDs:         2.25 cm  LV e' lateral:   5.44 cm/s LV PW:         1.29 cm  LV E/e' lateral: 12.6 LV IVS:        1.22 cm  LV e' medial:    4.57 cm/s LVOT diam:     2.00 cm  LV E/e' medial:  15.0 LVOT Area:     3.14 cm  RIGHT VENTRICLE RV S prime:     13.80 cm/s TAPSE (M-mode): 2.5 cm LEFT ATRIUM           Index       RIGHT ATRIUM          Index LA diam:      4.10 cm 2.03 cm/m  RA Area:     9.12 cm LA Vol (A2C): 46.9 ml 23.20 ml/m RA Volume:   14.90 ml 7.37 ml/m LA Vol (A4C): 24.3 ml 12.02 ml/m   AORTA Ao Root diam: 2.90 cm MITRAL VALVE                TRICUSPID VALVE MV Area (PHT): 3.21 cm     TR Peak grad:   14.0 mmHg MV Decel Time: 236 msec     TR Vmax:        187.00 cm/s MV E velocity: 68.40 cm/s MV A velocity: 120.00 cm/s  SHUNTS MV E/A ratio:  0.57         Systemic Diam: 2.00 cm Kirk Ruths MD Electronically signed by Kirk Ruths MD Signature Date/Time: 02/29/2020/3:27:34 PM    Final      EKG: normal EKG, normal sinus rhythm.   Physical Examination: Temp:  [98.1 F (36.7 C)-99.2 F (37.3 C)] 98.3 F (36.8 C) (03/08 1155) Pulse Rate:  [71-85] 85 (03/08 1155) Resp:  [14-18] 16 (03/08 1155) BP: (113-141)/(57-86) 126/74 (03/08 1155) SpO2:  [94 %-99 %] 97 % (03/08 1155)  General - Well nourished, well developed, in no apparent distress.  Ophthalmologic - fundi not visualized due to noncooperation.  Cardiovascular - Regular rate and rhythm.  Mental Status -  Level of arousal and orientation to time, place, and person were intact. Language including expression, naming, repetition, comprehension was assessed and found intact. Fund of Knowledge was assessed and was intact.  Cranial Nerves II - XII - II - Visual field intact OU. III, IV, VI - Extraocular movements intact. V - Facial sensation intact bilaterally. VII - Facial movement intact bilaterally. VIII - Hearing & vestibular intact bilaterally. X - Palate elevates symmetrically. XI - Chin turning &  shoulder shrug intact bilaterally. XII - Tongue protrusion intact.  Motor Strength - The patient's strength was normal in all extremities and pronator drift was absent.  Bulk was normal and fasciculations were absent.   Motor Tone - Muscle tone was assessed at the neck and appendages and was normal.  Reflexes - The patient's reflexes were symmetrical in all extremities  and she had no pathological reflexes.  Sensory - Light touch, temperature/pinprick were assessed and were symmetrical.    Coordination - The patient had normal movements in the hands with no ataxia or dysmetria.  Tremor was absent.  Gait and Station - deferred.   Assessment:  Debra Olsen is a 84 y.o. female with history of  DB, diverticulitis, vertigo, cataracts, GERD who presented to [facility] ED reporting L facial numbness, shakiness, OS blurred vision for several days, LKW is unknown. They found her to have mildly decreased sensation in L face and arm and possible L temporal field cut. NIHSS 2. She is on aspirin. Not a tPA candidate given outside the window. MRI neg for acute infarct.    Possible TIA  CTA head & neck no LVO. Aortic atherosclerosis. B ICA bifurcation atherosclerosis. R supraclinoid ICA 38mm aneurysm. Distal BA 50% stenosis.   CT perfusion Unremarkable   MRI  Unremarkable   2D Echo EF 60-65%. No source of embolus   EEG normal  LDL 197  HgbA1c 6.0   Lovenox 40 mg sq daily for VTE prophylaxis  aspirin 81 mg daily prior to admission, now on aspirin 81 mg daily and clopidogrel 75 mg daily. Continue DAPT x 3 weeks then plavix alone.    Therapy recommendations:  No therapy needs  Disposition:  Return home  [place] for d/c from stroke standpoint  Follow up stroke clinic in 4 weeks. Order placed.  Hyperlipidemia  Home meds:  No statin  Now on lipitor 80   LDL 197, goal < 70  Continue statin at discharge  Diabetes type II, Controlled  HgbA1c 6.0, goal <  7.0  CBGs  SSI  PCP follow up  UTI  UA WBC > 50  Urine culture showed E Coli, pan-sensitive  On bactrim DS  B12 deficiency  B12 = 151  On B12 supplement  Other Stroke Risk Factors  Advanced age  Hospital day # 1   Thank you for this consultation and allowing [place] to participate in the care of this patient.  Neurology will sign off. Please call with questions. Pt will follow up with stroke clinic NP at [facility] in about 4 weeks. Thanks for the consult.  Rosalin Hawking, MD PhD Stroke Neurology 03/01/2020 2:07 PM  To contact Stroke Continuity provider, please refer to [email or web address] After hours, contact General Neurology

## 2020-03-01 NOTE — Evaluation (Signed)
Occupational Therapy Evaluation and Discharge Patient Details Name: Baelynn Jeppesen MRN: [id number] DOB: [birth year] Today's Date: 03/01/2020    History of Present Illness Drishya Wetmore  is a 84 y.o. female, with history of diabetes mellitus without any complication, diverticulosis, GERD, arthritis came to hospital with complaints of numbness of left side of her face.  But states her left arm felt funny and weird. MRI: negative   Clinical Impression   This 84 yo female admitted with above and with PLOF of independent now presents to acute OT back at her baseline. No issues noted during evaluation with ADLs or mobility. We will sign off.    Follow Up Recommendations  No OT follow up    Equipment Recommendations  None recommended by OT       Precautions / Restrictions Precautions Precautions: Fall Precaution Comments: When she is dizzy (but not dizzy today) Restrictions Weight Bearing Restrictions: No      Mobility Bed Mobility Overal bed mobility: Independent                Transfers Overall transfer level: Modified independent Equipment used: None             General transfer comment: slower pace    Balance Overall balance assessment: No apparent balance deficits (not formally assessed)                                         ADL either performed or assessed with clinical judgement   ADL Overall ADL's : Modified independent                                       General ADL Comments: Increased time     Vision Patient Visual Report: No change from baseline              Pertinent Vitals/Pain Pain Assessment: No/denies pain     Hand Dominance Right   Extremity/Trunk Assessment Upper Extremity Assessment Upper Extremity Assessment: Overall WFL for tasks assessed   Lower Extremity Assessment Lower Extremity Assessment: Overall WFL for tasks assessed       Communication Communication Communication: No  difficulties   Cognition Arousal/Alertness: Awake/alert Behavior During Therapy: WFL for tasks assessed/performed Overall Cognitive Status: Within Functional Limits for tasks assessed                                     General Comments  Pt able to ambulate up and down the hallway looking right and left and up and down without balance issues            Home Living Family/patient expects to be discharged to:: Private residence Living Arrangements: Spouse/significant other Available Help at Discharge: Family;Available 24 hours/day Type of Home: House Home Access: Ramped entrance     Home Layout: One level     Bathroom Shower/Tub: Walk-in shower;Door   [employer] Toilet: Handicapped height     Home Equipment: Shower seat - built in          Prior Functioning/Environment Level of Independence: Independent                          OT Goals(Current goals can be found in  the care plan section) Acute Rehab OT Goals Patient Stated Goal: to go home today  OT Frequency:                AM-PAC OT "6 Clicks" Daily Activity     Outcome Measure Help from another person eating meals?: None Help from another person taking care of personal grooming?: None Help from another person toileting, which includes using toliet, bedpan, or urinal?: None Help from another person bathing (including washing, rinsing, drying)?: None Help from another person to put on and taking off regular upper body clothing?: None Help from another person to put on and taking off regular lower body clothing?: None 6 Click Score: 24   End of Session Equipment Utilized During Treatment: Gait belt  Activity Tolerance: Patient tolerated treatment well Patient left: in chair;with chair alarm set;with call bell/phone within reach  OT Visit Diagnosis: Muscle weakness (generalized) (M62.81)                Time: [id number] OT Time Calculation (min): 22 min Charges:  OT General  Charges $OT Visit: 1 Visit OT Evaluation $OT Eval Moderate Complexity: 1 Mod  Cathy  OTR/L Acute [employer] Pager [phone] Office [phone]     03/01/2020, 12:53 PM

## 2020-03-01 NOTE — Procedures (Signed)
Patient Name: Debra Olsen  MRN: [id number]  Epilepsy Attending: Lora Havens  Referring Physician/Provider: Dr. Rosalin Hawking Date: 03/01/2020 Duration: 24.55 minutes  Patient history: 84 year old female who presented with left-sided facial numbness.  MRI brain did not show any acute stroke.  EEG to evaluate for seizures.  Level of alertness: Awake  AEDs during EEG study: None  Technical aspects: This EEG study was done with scalp electrodes positioned according to the 10-20 International system of electrode placement. Electrical activity was acquired at a sampling rate of 500Hz  and reviewed with a high frequency filter of 70Hz  and a low frequency filter of 1Hz . EEG data were recorded continuously and digitally stored.   Description: The posterior dominant rhythm consists of 9-10 Hz activity of moderate voltage (25-35 uV) seen predominantly in posterior head regions, symmetric and reactive to eye opening and eye closing.  Physiologic photic driving was not seen during photic stimulation.  Hyperventilation was not performed.        IMPRESSION: This study is within normal limits. No seizures or epileptiform discharges were seen throughout the recording.  Debra Olsen

## 2020-03-01 NOTE — Discharge Summary (Addendum)
Physician Discharge Summary  Debra Olsen [id number] DOB: [birth year] DOA: 02/29/2020  PCP: Glenda Chroman, MD  Admit date: 02/29/2020 Discharge date: 03/01/2020  Time spent: 45 minutes  Recommendations for Outpatient Follow-up:  Patient will be discharged to home.  Patient will need to follow up with primary care provider within one week of discharge.  Follow up with neurology. Patient should continue medications as prescribed.  Patient should follow a heart healthy diet.   Discharge Diagnoses:  Possible TIA Hyperlipidemia Diabetes mellitus, type II vs prediabetes UTI Vitamin B12 deficiency  Discharge Condition: Stable  Diet recommendation: heart healthy  Filed Weights   02/29/20 0044  Weight: 88.5 kg    History of present illness:  On 02/29/2020 by Dr. Eleonore Chiquito Magdiel Olsen  is a 84 y.o. female, with history of diabetes mellitus without any complication, diverticulosis, GERD, arthritis came to hospital with complaints of numbness of left side of her face.  Patient said that lasted for about an hour.  It started on 1030 tonight.  Patient denies any blurred vision, no slurred speech , no difficulty swallowing, denies focal weakness of arms or legs.  But states her left arm felt funny and weird.  She denies previous history of stroke or seizures.  She has been having dizzy episodes for past few weeks.  Patient took 3 baby aspirin after facial numbness started.  She has diabetes but does not take any medications. In the ED tele neurology was consulted, patient underwent CTA head and neck and CT cerebral perfusion study.  All the studies were unremarkable.  MRI brain and echocardiogram has been recommended.  She denies chest pain or shortness of breath. Denies abdominal pain or dysuria. Denies nausea vomiting or diarrhea.  Hospital Course:  Possible TIA -Patient presented with left facial numbness and shakiness along with some blurred vision -CTA head and neck showed no LVO.   Aortic atherosclerosis.  Bilateral ICA bifurcation atherosclerosis.  Right supraclinoid ICA 3 mm aneurysm.  Distal BA 50% stenosis -CT perfusion unremarkable -MRI head unremarkable for acute intercranial normalities -Echocardiogram shows an EF of 60 to 65%.  Grade 1 diastolic dysfunction. No source of embolism -EEG unremarkable -LDL 197, hemoglobin A1c 6 -Patient was on aspirin prior to admit -Was placed on aspirin and Plavix. -Neurology consulted and appreciated, recommended aspirin and plavix for 3 weeks followed by Plavix alone. -OT and OT no further recommendations -Follow-up in stroke clinic in 4 weeks  Hyperlipidemia -Patient was not on any medications prior to admission.  Continue statin  Diabetes mellitus, type II vs prediabetes -Hemoglobin A1c 6 -Patient states she does not have diabetes and she does not watch her diet  UTI -Urine culture shows E. coli which is pansensitive -Patient was started on Bactrim DS -patient with dysuria  Vitamin B12 deficiency -B12 is 151, continue B12 supplementation  Procedures: EEG Echocardiogram  Consultations: Neurology   Discharge Exam: Vitals:   03/01/20 0849 03/01/20 1155  BP: 132/69 126/74  Pulse: 71 85  Resp: 14 16  Temp: 98.4 F (36.9 C) 98.3 F (36.8 C)  SpO2: 96% 97%     General: Well developed, well nourished, NAD, appears stated age  HEENT: NCAT, mucous membranes moist.  Cardiovascular: S1 S2 auscultated, no rubs, murmurs or gallops. Regular rate and rhythm.  Respiratory: Clear to auscultation bilaterally   Abdomen: Soft, nontender, nondistended, + bowel sounds  Extremities: warm dry without cyanosis clubbing or edema  Neuro: AAOx3, nonfocal  Psych: Appropriate mood and affect, pleasant   Discharge  Instructions Discharge Instructions    Ambulatory referral to Neurology   Complete by: As directed    Follow up in TIA clinic at [facility] with [name], NP in about 4 weeks. If  not available, consider [name], [name], or [name].   Discharge instructions   Complete by: As directed    Patient will be discharged to home.  Patient will need to follow up with primary care provider within one week of discharge.  Follow up with neurology. Patient should continue medications as prescribed.  Patient should follow a heart healthy diet.   Take aspirin and plavix for 3 weeks. Afterwards, only continue plavix.     Allergies as of 03/01/2020      Reactions   Lidocaine Other (See Comments)   Passed out   Penicillins Rash         Medication List    TAKE these medications   acetaminophen 500 MG tablet Commonly known as: TYLENOL Take 1,000 mg by mouth every 6 (six) hours as needed for mild pain.   aspirin EC 81 MG tablet Take 1 tablet (81 mg total) by mouth daily.   atorvastatin 80 MG tablet Commonly known as: LIPITOR Take 1 tablet (80 mg total) by mouth daily at 6 PM.   clopidogrel 75 MG tablet Commonly known as: PLAVIX Take 1 tablet (75 mg total) by mouth daily. Start taking on: March 02, 2020   cyanocobalamin 1000 MCG tablet Take 1 tablet (1,000 mcg total) by mouth daily. Start taking on: March 02, 2020   diphenhydrAMINE 25 MG tablet Commonly known as: BENADRYL 25 mg at bedtime.   ibuprofen 200 MG tablet Commonly known as: ADVIL Take 200 mg by mouth every 6 (six) hours as needed for mild pain or moderate pain.   sulfamethoxazole-trimethoprim 800-160 MG tablet Commonly known as: BACTRIM DS Take 1 tablet by mouth every 12 (twelve) hours for 2 days. Completion of hospital course.      Allergies  Allergen Reactions  . Lidocaine Other (See Comments)    Passed out  . Penicillins Rash       Follow-up Information    [facility] Follow up in 4 week(s).   Specialty: Neurology Why: TIA clinic. office will call with appt date and time.  Contact information: [address] [place] [phone]       Glenda Chroman, MD. Schedule an appointment as soon as possible for a visit in 1 week(s).   Specialty: Internal Medicine Why: Hospital follow up Contact information: [place] [address] [phone]            The results of significant diagnostics from this hospitalization (including imaging, microbiology, ancillary and laboratory) are listed below for reference.    Significant Diagnostic Studies: CT Code Stroke CTA Head W/WO contrast  Result Date: 02/29/2020 CLINICAL DATA:  Left temporal field cut. Left sensory loss. Dizziness. Vertigo. EXAM: CT ANGIOGRAPHY HEAD AND NECK CT PERFUSION BRAIN TECHNIQUE: Multidetector CT imaging of the head and neck was performed using the standard protocol during bolus administration of intravenous contrast. Multiplanar CT image reconstructions and MIPs were obtained to evaluate the vascular anatomy. Carotid stenosis measurements (when applicable) are obtained utilizing NASCET criteria, using the distal internal carotid diameter as the denominator. Multiphase CT imaging of the brain was performed following IV bolus contrast injection. Subsequent parametric perfusion maps were calculated using RAPID software. CONTRAST:  175mL OMNIPAQUE IOHEXOL 350  MG/ML SOLN COMPARISON:  None. FINDINGS: CT HEAD FINDINGS Brain: Age related generalized atrophy. No sign of acute infarction, mass lesion, hemorrhage, hydrocephalus or extra-axial collection. Vascular: There is atherosclerotic calcification of the major vessels at the base of the brain. Skull: Negative Sinuses/Orbits: Clear/normal Other: None ASPECTS ([place] Stroke Program Early CT Score) - Ganglionic level infarction (caudate, lentiform nuclei, internal capsule, insula, M1-M3 cortex): 7 - Supraganglionic infarction (M4-M6 cortex): 3 Total score (0-10 with 10 being normal): 10 Review of the MIP images confirms the above findings CTA NECK FINDINGS Aortic arch: Aortic  atherosclerosis. No aneurysm or dissection. Branching pattern is normal. Right carotid system: Common carotid artery widely patent to the bifurcation region. Calcified plaque at the carotid bifurcation and ICA bulb. No stenosis of the ICA however. Left carotid system: Common carotid artery widely patent to the bifurcation. Calcified plaque at the carotid bifurcation and ICA bulb. No stenosis of the ICA however. Vertebral arteries: Both vertebral arteries widely patent at their origins and through the cervical region to the foramen magnum. Skeleton: Ordinary cervical spondylosis. Other neck: No mass or lymphadenopathy. Upper chest: No active process. Review of the MIP images confirms the above findings CTA HEAD FINDINGS Anterior circulation: Both internal carotid arteries widely patent through the skull base and siphon regions. Ordinary siphon atherosclerotic calcification but no stenosis. The anterior and middle cerebral vessels are widely patent. No large or medium vessel occlusion. 3 mm atherosclerotic aneurysm projecting medially from the supraclinoid ICA on the right. Posterior circulation: Both vertebral arteries widely patent to the basilar. Stenosis of the distal basilar artery of 50%. Flow is present in both superior cerebellar and posterior cerebral arteries. Fetal origin of the left PCA. Venous sinuses: Patent and normal. Anatomic variants: None significant otherwise. Review of the MIP images confirms the above findings CT Brain Perfusion Findings: ASPECTS: 10 CBF (<30%) Volume: 66mL Perfusion (Tmax>6.0s) volume: 32mL Mismatch Volume: 88mL Infarction Location:None IMPRESSION: No acute infarction by CT or perfusion study. Aortic atherosclerosis. Mild atherosclerotic disease at both carotid bifurcations but no flow limiting stenosis. No intracranial large or medium vessel occlusion. 3 mm atherosclerotic aneurysm of the supraclinoid ICA on the right, not likely clinically significant. 50% distal basilar artery  stenosis but with sufficient perfusion of the posterior circulation vessels by CTA. Findings discussed with Dr. Tomi Bamberger at 0320 hours. Electronically Signed   By: Nelson Chimes M.D.   On: 02/29/2020 03:27   DG Chest 2 View  Result Date: 02/29/2020 CLINICAL DATA:  TIA. EXAM: CHEST - 2 VIEW COMPARISON:  Chest radiograph 03/25/2016 FINDINGS: Stable cardiomediastinal contours. Aortic arch calcification noted. The lungs are clear. No pneumothorax or significant pleural effusion. No acute finding in the visualized skeleton. IMPRESSION: No acute cardiopulmonary process. Aortic atherosclerosis. Electronically Signed   By: Audie Pinto M.D.   On: 02/29/2020 13:43   CT Code Stroke CTA Neck W/WO contrast  Result Date: 02/29/2020 CLINICAL DATA:  Left temporal field cut. Left sensory loss. Dizziness. Vertigo. EXAM: CT ANGIOGRAPHY HEAD AND NECK CT PERFUSION BRAIN TECHNIQUE: Multidetector CT imaging of the head and neck was performed using the standard protocol during bolus administration of intravenous contrast. Multiplanar CT image reconstructions and MIPs were obtained to evaluate the vascular anatomy. Carotid stenosis measurements (when applicable) are obtained utilizing NASCET criteria, using the distal internal carotid diameter as the denominator. Multiphase CT imaging of the brain was performed following IV bolus contrast injection. Subsequent parametric perfusion maps were calculated using RAPID software. CONTRAST:  153mL OMNIPAQUE IOHEXOL 350 MG/ML SOLN COMPARISON:  None. FINDINGS: CT HEAD FINDINGS Brain: Age related generalized atrophy. No sign of acute infarction, mass lesion, hemorrhage, hydrocephalus or extra-axial collection. Vascular: There is atherosclerotic calcification of the major vessels at the base of the brain. Skull: Negative Sinuses/Orbits: Clear/normal Other: None ASPECTS ([place] Stroke Program Early CT Score) - Ganglionic level infarction (caudate, lentiform nuclei, internal capsule, insula,  M1-M3 cortex): 7 - Supraganglionic infarction (M4-M6 cortex): 3 Total score (0-10 with 10 being normal): 10 Review of the MIP images confirms the above findings CTA NECK FINDINGS Aortic arch: Aortic atherosclerosis. No aneurysm or dissection. Branching pattern is normal. Right carotid system: Common carotid artery widely patent to the bifurcation region. Calcified plaque at the carotid bifurcation and ICA bulb. No stenosis of the ICA however. Left carotid system: Common carotid artery widely patent to the bifurcation. Calcified plaque at the carotid bifurcation and ICA bulb. No stenosis of the ICA however. Vertebral arteries: Both vertebral arteries widely patent at their origins and through the cervical region to the foramen magnum. Skeleton: Ordinary cervical spondylosis. Other neck: No mass or lymphadenopathy. Upper chest: No active process. Review of the MIP images confirms the above findings CTA HEAD FINDINGS Anterior circulation: Both internal carotid arteries widely patent through the skull base and siphon regions. Ordinary siphon atherosclerotic calcification but no stenosis. The anterior and middle cerebral vessels are widely patent. No large or medium vessel occlusion. 3 mm atherosclerotic aneurysm projecting medially from the supraclinoid ICA on the right. Posterior circulation: Both vertebral arteries widely patent to the basilar. Stenosis of the distal basilar artery of 50%. Flow is present in both superior cerebellar and posterior cerebral arteries. Fetal origin of the left PCA. Venous sinuses: Patent and normal. Anatomic variants: None significant otherwise. Review of the MIP images confirms the above findings CT Brain Perfusion Findings: ASPECTS: 10 CBF (<30%) Volume: 45mL Perfusion (Tmax>6.0s) volume: 90mL Mismatch Volume: 92mL Infarction Location:None IMPRESSION: No acute infarction by CT or perfusion study. Aortic atherosclerosis. Mild atherosclerotic disease at both carotid bifurcations but no flow  limiting stenosis. No intracranial large or medium vessel occlusion. 3 mm atherosclerotic aneurysm of the supraclinoid ICA on the right, not likely clinically significant. 50% distal basilar artery stenosis but with sufficient perfusion of the posterior circulation vessels by CTA. Findings discussed with Dr. Tomi Bamberger at 0320 hours. Electronically Signed   By: Nelson Chimes M.D.   On: 02/29/2020 03:27   MR BRAIN WO CONTRAST  Result Date: 03/01/2020 CLINICAL DATA:  Initial evaluation for focal neural deficit, stroke suspected. EXAM: MRI HEAD WITHOUT CONTRAST TECHNIQUE: Multiplanar, multiecho pulse sequences of the brain and surrounding structures were obtained without intravenous contrast. COMPARISON:  Prior CTs from earlier the same day. FINDINGS: Brain: Generalized age-related cerebral atrophy. Mild T2/FLAIR hyperintensity seen involving the periventricular white matter most consistent with chronic small vessel ischemic disease, felt to be within normal limits for age. No abnormal foci of restricted diffusion to suggest acute or subacute ischemia. Gray-white matter differentiation maintained. No encephalomalacia to suggest chronic cortical infarction. No foci of susceptibility artifact to suggest acute or chronic intracranial hemorrhage. No mass lesion, midline shift or mass effect. No hydrocephalus or extra-axial fluid collection. Pituitary gland suprasellar region normal. Midline structures intact. Vascular: Major intracranial vascular flow voids are maintained. Skull and upper cervical spine: Craniocervical junction within normal limits. Bone marrow signal intensity normal. No scalp soft tissue abnormality. Sinuses/Orbits: Patient status post bilateral ocular lens replacement. Paranasal sinuses are largely clear. No mastoid effusion. Inner ear structures grossly normal. Other: None. IMPRESSION: Normal  brain MRI for age. No acute intracranial abnormality identified. Electronically Signed   By: Jeannine Boga  M.D.   On: 03/01/2020 00:33   CT Code Stroke Cerebral Perfusion with contrast  Result Date: 02/29/2020 CLINICAL DATA:  Left temporal field cut. Left sensory loss. Dizziness. Vertigo. EXAM: CT ANGIOGRAPHY HEAD AND NECK CT PERFUSION BRAIN TECHNIQUE: Multidetector CT imaging of the head and neck was performed using the standard protocol during bolus administration of intravenous contrast. Multiplanar CT image reconstructions and MIPs were obtained to evaluate the vascular anatomy. Carotid stenosis measurements (when applicable) are obtained utilizing NASCET criteria, using the distal internal carotid diameter as the denominator. Multiphase CT imaging of the brain was performed following IV bolus contrast injection. Subsequent parametric perfusion maps were calculated using RAPID software. CONTRAST:  14mL OMNIPAQUE IOHEXOL 350 MG/ML SOLN COMPARISON:  None. FINDINGS: CT HEAD FINDINGS Brain: Age related generalized atrophy. No sign of acute infarction, mass lesion, hemorrhage, hydrocephalus or extra-axial collection. Vascular: There is atherosclerotic calcification of the major vessels at the base of the brain. Skull: Negative Sinuses/Orbits: Clear/normal Other: None ASPECTS ([place] Stroke Program Early CT Score) - Ganglionic level infarction (caudate, lentiform nuclei, internal capsule, insula, M1-M3 cortex): 7 - Supraganglionic infarction (M4-M6 cortex): 3 Total score (0-10 with 10 being normal): 10 Review of the MIP images confirms the above findings CTA NECK FINDINGS Aortic arch: Aortic atherosclerosis. No aneurysm or dissection. Branching pattern is normal. Right carotid system: Common carotid artery widely patent to the bifurcation region. Calcified plaque at the carotid bifurcation and ICA bulb. No stenosis of the ICA however. Left carotid system: Common carotid artery widely patent to the bifurcation. Calcified plaque at the carotid bifurcation and ICA bulb. No stenosis of the ICA however. Vertebral arteries:  Both vertebral arteries widely patent at their origins and through the cervical region to the foramen magnum. Skeleton: Ordinary cervical spondylosis. Other neck: No mass or lymphadenopathy. Upper chest: No active process. Review of the MIP images confirms the above findings CTA HEAD FINDINGS Anterior circulation: Both internal carotid arteries widely patent through the skull base and siphon regions. Ordinary siphon atherosclerotic calcification but no stenosis. The anterior and middle cerebral vessels are widely patent. No large or medium vessel occlusion. 3 mm atherosclerotic aneurysm projecting medially from the supraclinoid ICA on the right. Posterior circulation: Both vertebral arteries widely patent to the basilar. Stenosis of the distal basilar artery of 50%. Flow is present in both superior cerebellar and posterior cerebral arteries. Fetal origin of the left PCA. Venous sinuses: Patent and normal. Anatomic variants: None significant otherwise. Review of the MIP images confirms the above findings CT Brain Perfusion Findings: ASPECTS: 10 CBF (<30%) Volume: 61mL Perfusion (Tmax>6.0s) volume: 17mL Mismatch Volume: 1mL Infarction Location:None IMPRESSION: No acute infarction by CT or perfusion study. Aortic atherosclerosis. Mild atherosclerotic disease at both carotid bifurcations but no flow limiting stenosis. No intracranial large or medium vessel occlusion. 3 mm atherosclerotic aneurysm of the supraclinoid ICA on the right, not likely clinically significant. 50% distal basilar artery stenosis but with sufficient perfusion of the posterior circulation vessels by CTA. Findings discussed with Dr. Tomi Bamberger at 0320 hours. Electronically Signed   By: Nelson Chimes M.D.   On: 02/29/2020 03:27   EEG adult  Result Date: 03/01/2020 Lora Havens, MD     03/01/2020 10:52 AM Patient Name: Shakilya Forgues MRN: [id number] Epilepsy Attending: Lora Havens Referring Physician/Provider: Dr. Rosalin Hawking Date: 03/01/2020 Duration:  24.55 minutes Patient history: 84 year old female who presented with left-sided facial  numbness.  MRI brain did not show any acute stroke.  EEG to evaluate for seizures. Level of alertness: Awake AEDs during EEG study: None Technical aspects: This EEG study was done with scalp electrodes positioned according to the 10-20 International system of electrode placement. Electrical activity was acquired at a sampling rate of 500Hz  and reviewed with a high frequency filter of 70Hz  and a low frequency filter of 1Hz . EEG data were recorded continuously and digitally stored. Description: The posterior dominant rhythm consists of 9-10 Hz activity of moderate voltage (25-35 uV) seen predominantly in posterior head regions, symmetric and reactive to eye opening and eye closing.  Physiologic photic driving was not seen during photic stimulation.  Hyperventilation was not performed.      IMPRESSION: This study is within normal limits. No seizures or epileptiform discharges were seen throughout the recording. Lora Havens   ECHOCARDIOGRAM COMPLETE  Result Date: 02/29/2020    ECHOCARDIOGRAM REPORT   Patient Name:   MASAYE DOWNES Date of Exam: 02/29/2020 Medical Rec #:  [id number]     Height:       68.0 in Accession #:    [id number]    Weight:       195.0 lb Date of Birth:  Sep 27, 1936     BSA:          2.022 m Patient Age:    84 years      BP:           143/88 mmHg Patient Gender: F             HR:           80 bpm. Exam Location:  [facility] Procedure: 2D Echo Indications:    TIA 435.9 / G45.9  History:        Patient has no prior history of Echocardiogram examinations.                 TIA; Risk Factors:Non-Smoker and Diabetes. GERD.  Sonographer:    Leavy Cella RDCS (AE) Referring Phys: [place]  1. Normal LV systolic function; mild LVH; grade 1 diastolic dysfunction; trace AI.  2. Left ventricular ejection fraction, by estimation, is 60 to 65%. The left ventricle has normal function. The left ventricle  has no regional wall motion abnormalities. There is mild left ventricular hypertrophy. Left ventricular diastolic parameters are consistent with Grade I diastolic dysfunction (impaired relaxation).  3. Right ventricular systolic function is normal. The right ventricular size is normal. There is normal pulmonary artery systolic pressure.  4. The mitral valve is normal in structure and function. Trivial mitral valve regurgitation. No evidence of mitral stenosis.  5. The aortic valve is normal in structure and function. Aortic valve regurgitation is trivial. No aortic stenosis is present.  6. The inferior vena cava is normal in size with greater than 50% respiratory variability, suggesting right atrial pressure of 3 mmHg. FINDINGS  Left Ventricle: Left ventricular ejection fraction, by estimation, is 60 to 65%. The left ventricle has normal function. The left ventricle has no regional wall motion abnormalities. The left ventricular internal cavity size was normal in size. There is  mild left ventricular hypertrophy. Left ventricular diastolic parameters are consistent with Grade I diastolic dysfunction (impaired relaxation). Right Ventricle: The right ventricular size is normal. Right ventricular systolic function is normal. There is normal pulmonary artery systolic pressure. The tricuspid regurgitant velocity is 1.87 m/s, and with an assumed right atrial pressure of 10 mmHg, the estimated  right ventricular systolic pressure is 0000000 mmHg. Left Atrium: Left atrial size was normal in size. Right Atrium: Right atrial size was normal in size. Pericardium: There is no evidence of pericardial effusion. Mitral Valve: The mitral valve is normal in structure and function. Normal mobility of the mitral valve leaflets. Trivial mitral valve regurgitation. No evidence of mitral valve stenosis. Tricuspid Valve: The tricuspid valve is normal in structure. Tricuspid valve regurgitation is trivial. No evidence of tricuspid stenosis.  Aortic Valve: The aortic valve is normal in structure and function. Aortic valve regurgitation is trivial. No aortic stenosis is present. Pulmonic Valve: The pulmonic valve was normal in structure. Pulmonic valve regurgitation is trivial. No evidence of pulmonic stenosis. Aorta: The aortic root is normal in size and structure. Venous: The inferior vena cava is normal in size with greater than 50% respiratory variability, suggesting right atrial pressure of 3 mmHg. IAS/Shunts: No atrial level shunt detected by color flow Doppler. Additional Comments: Normal LV systolic function; mild LVH; grade 1 diastolic dysfunction; trace AI.  LEFT VENTRICLE PLAX 2D LVIDd:         3.66 cm  Diastology LVIDs:         2.25 cm  LV e' lateral:   5.44 cm/s LV PW:         1.29 cm  LV E/e' lateral: 12.6 LV IVS:        1.22 cm  LV e' medial:    4.57 cm/s LVOT diam:     2.00 cm  LV E/e' medial:  15.0 LVOT Area:     3.14 cm  RIGHT VENTRICLE RV S prime:     13.80 cm/s TAPSE (M-mode): 2.5 cm LEFT ATRIUM           Index       RIGHT ATRIUM          Index LA diam:      4.10 cm 2.03 cm/m  RA Area:     9.12 cm LA Vol (A2C): 46.9 ml 23.20 ml/m RA Volume:   14.90 ml 7.37 ml/m LA Vol (A4C): 24.3 ml 12.02 ml/m   AORTA Ao Root diam: 2.90 cm MITRAL VALVE                TRICUSPID VALVE MV Area (PHT): 3.21 cm     TR Peak grad:   14.0 mmHg MV Decel Time: 236 msec     TR Vmax:        187.00 cm/s MV E velocity: 68.40 cm/s MV A velocity: 120.00 cm/s  SHUNTS MV E/A ratio:  0.57         Systemic Diam: 2.00 cm Kirk Ruths MD Electronically signed by Kirk Ruths MD Signature Date/Time: 02/29/2020/3:27:34 PM    Final     Microbiology: Recent Results (from the past 240 hour(s))  Urine culture     Status: Abnormal (Preliminary result)   Collection Time: 02/29/20  1:55 AM   Specimen: Urine, Clean Catch  Result Value Ref Range Status   Specimen Description   Final    URINE, CLEAN CATCH Performed at [facility], [address], [place], [place]  [address]    Special Requests   Final    NONE Performed at [facility], [address] [place], [place] [address]    Culture (A)  Final    >=100,000 COLONIES/mL ESCHERICHIA COLI CULTURE REINCUBATED FOR BETTER GROWTH Performed at [facility], [place] [address], [place],  [address]    Report Status PENDING  Incomplete  SARS CORONAVIRUS 2 (TAT 6-24 HRS) Nasopharyngeal Nasopharyngeal Swab     Status: None   Collection Time: 02/29/20  4:24 AM   Specimen: Nasopharyngeal Swab  Result Value Ref Range Status   SARS Coronavirus 2 NEGATIVE NEGATIVE Final    Comment: (NOTE) SARS-CoV-2 target nucleic acids are NOT DETECTED. The SARS-CoV-2 RNA is generally detectable in upper and lower respiratory specimens during the acute phase of infection. Negative results do not preclude SARS-CoV-2 infection, do not rule out co-infections with other pathogens, and should not be used as the sole basis for treatment or other patient management decisions. Negative results must be combined with clinical observations, patient history, and epidemiological information. The expected result is Negative. Fact Sheet for Patients: SugarRoll.be Fact Sheet for Healthcare Providers: https://www.woods-mathews.com/ This test is not yet approved or cleared by the [place] FDA and  has been authorized for detection and/or diagnosis of SARS-CoV-2 by FDA under an Emergency Use Authorization (EUA). This EUA will remain  in effect (meaning this test can be used) for the duration of the COVID-19 declaration under Section 56 4(b)(1) of the Act, 21 U.S.C. section 360bbb-3(b)(1), unless the authorization is terminated or revoked sooner. Performed at [facility], [place] [address] [place], [place] [address]      Labs: Basic Metabolic Panel: Recent Labs  Lab 02/29/20 0151  NA 140  K 4.0  CL 106  CO2 26  GLUCOSE 115*  BUN 17  CREATININE 0.98  CALCIUM 9.1    Liver Function Tests: Recent Labs  Lab 02/29/20 0151  AST 19  ALT 12  ALKPHOS 69  BILITOT 0.5  PROT 7.6  ALBUMIN 3.7   No results for input(s): LIPASE, AMYLASE in the last 168 hours. No results for input(s): AMMONIA in the last 168 hours. CBC: Recent Labs  Lab 02/29/20 0151  WBC 7.6  NEUTROABS 3.9  HGB 13.5  HCT 41.8  MCV 96.1  PLT 310   Cardiac Enzymes: No results for input(s): CKTOTAL, CKMB, CKMBINDEX, TROPONINI in the last 168 hours. BNP: BNP (last 3 results) No results for input(s): BNP in the last 8760 hours.  ProBNP (last 3 results) No results for input(s): PROBNP in the last 8760 hours.  CBG: No results for input(s): GLUCAP in the last 168 hours.     Signed:  Cristal Ford  [facility] 03/01/2020, 2:48 PM   Addendum Patient with Ecoli UTI, resistant to bactrim. Have called patient and alerted her.:  Have called in Cephalexin 500 mg x 5 days to [facility].

## 2020-03-01 NOTE — Progress Notes (Signed)
EEG complete - results pending 

## 2020-03-02 LAB — URINE CULTURE: Culture: >=100000 — AB

## 2020-03-25 DIAGNOSIS — H04123 Dry eye syndrome of bilateral lacrimal glands: Secondary | ICD-10-CM | POA: Diagnosis not present

## 2020-03-25 DIAGNOSIS — H02401 Unspecified ptosis of right eyelid: Secondary | ICD-10-CM | POA: Diagnosis not present

## 2020-03-25 DIAGNOSIS — H18413 Arcus senilis, bilateral: Secondary | ICD-10-CM | POA: Diagnosis not present

## 2020-03-25 DIAGNOSIS — H40013 Open angle with borderline findings, low risk, bilateral: Secondary | ICD-10-CM | POA: Diagnosis not present

## 2020-03-25 DIAGNOSIS — H26493 Other secondary cataract, bilateral: Secondary | ICD-10-CM | POA: Diagnosis not present

## 2020-03-25 DIAGNOSIS — H02402 Unspecified ptosis of left eyelid: Secondary | ICD-10-CM | POA: Diagnosis not present

## 2020-03-25 DIAGNOSIS — Z961 Presence of intraocular lens: Secondary | ICD-10-CM | POA: Diagnosis not present

## 2020-04-07 ENCOUNTER — Other Ambulatory Visit: Payer: Self-pay

## 2020-04-07 ENCOUNTER — Ambulatory Visit (INDEPENDENT_AMBULATORY_CARE_PROVIDER_SITE_OTHER): Payer: Medicare Other | Admitting: Adult Health

## 2020-04-07 ENCOUNTER — Encounter: Payer: Self-pay | Admitting: Adult Health

## 2020-04-07 VITALS — BP 132/76 | HR 80 | Temp 97.1°F | Ht 68.0 in | Wt 195.6 lb

## 2020-04-07 DIAGNOSIS — E785 Hyperlipidemia, unspecified: Secondary | ICD-10-CM

## 2020-04-07 DIAGNOSIS — E538 Deficiency of other specified B group vitamins: Secondary | ICD-10-CM

## 2020-04-07 DIAGNOSIS — G459 Transient cerebral ischemic attack, unspecified: Secondary | ICD-10-CM | POA: Diagnosis not present

## 2020-04-07 DIAGNOSIS — E119 Type 2 diabetes mellitus without complications: Secondary | ICD-10-CM

## 2020-04-07 MED ORDER — ROSUVASTATIN CALCIUM 40 MG PO TABS: 40.0000 mg | ORAL_TABLET | Freq: Every day | ORAL | 3 refills | Status: DC

## 2020-04-07 MED ORDER — CLOPIDOGREL BISULFATE 75 MG PO TABS: 75.0000 mg | ORAL_TABLET | Freq: Every day | ORAL | 3 refills | Status: DC

## 2020-04-07 NOTE — Progress Notes (Signed)
[facility] [address] [place]. [place] [address] [phone]       HOSPITAL FOLLOW UP NOTE  Ms. Beulah Gandy Date of Birth:  Feb 15, 1936 Medical Record Number:  [id number]   Reason for Referral:  hospital stroke follow up    SUBJECTIVE:   CHIEF COMPLAINT:  Chief Complaint  Patient presents with  . Hospitalization Follow-up    Rm 9, daughter, Otila Kluver, no therapy   . Cerebrovascular Accident    HPI:   Ms. Loyalty Jeschke is a 84 y.o. female with history of  DB, diverticulitis, vertigo, cataracts, GERD who presented on 02/29/2020 to [facility] ED reporting L facial numbness, shakiness, OS blurred vision for several days, LKW is unknown. They found her to have mildly decreased sensation in L face and arm and possible L temporal field cut.  Transferred to [facility] and evaluated by stroke team and Dr. Erlinda Hong but likely TIA as MRI negative for acute infarct.  Recommended DAPT for 3 weeks and Plavix alone as previously on aspirin.  LDL 197 and initiated atorvastatin 80 mg daily.  Controlled DM with A1c 6.0.  No prior history of HTN.  B12 deficiency at 151 and placed on supplementation.  No prior history of stroke.  Discharged home in stable condition without therapy needs.  Possible TIA  CTA head & neck no LVO. Aortic atherosclerosis. B ICA bifurcation atherosclerosis. R supraclinoid ICA 88mm aneurysm. Distal BA 50% stenosis.   CT perfusion Unremarkable   MRI  Unremarkable   2D Echo EF 60-65%. No source of embolus   EEG normal  LDL 197 -initiate atorvastatin 80 mg daily  HgbA1c 6.0 -controlled DM  Lovenox 40 mg sq daily for VTE prophylaxis  aspirin 81 mg daily prior to admission, now on aspirin 81 mg daily and clopidogrel 75 mg daily. Continue DAPT x 3 weeks then plavix alone.    Therapy recommendations:  No therapy needs  Disposition:  Return home  Hope for d/c from stroke standpoint  Follow up stroke clinic in 4 weeks. Order placed.   Today,  04/08/2020, Mr. Vandenbroek has been doing well from a stroke standpoint without new or reoccurring stroke/TIA symptoms.  She has completed 3 weeks DAPT and continues on Plavix alone without bleeding or bruising.  She self discontinued atorvastatin due to statin myalgias with resolution of myalgias after discontinuing.  Blood pressure today satisfactory 132/76.  Currently in the process of establishing care with new PCP and is requesting refill for Plavix and consider initiating Crestor.  No concerns at this time.    ROS:   14 system review of systems performed and negative with exception of no complaints  PMH:  Past Medical History:  Diagnosis Date  . Arthritis   . Diabetes mellitus without complication ([place])   . Diverticulosis of intestine   . GERD (gastroesophageal reflux disease)     PSH:  Past Surgical History:  Procedure Laterality Date  . ABDOMINAL HYSTERECTOMY      Social History:  Social History   Socioeconomic History  . Marital status: Married    Spouse name: Not on file  . Number of children: Not on file  . Years of education: Not on file  . Highest education level: Not on file  Occupational History  . Not on file  Tobacco Use  . Smoking status: Never Smoker  . Smokeless tobacco: Never Used  Substance and Sexual Activity  . Alcohol use: No  . Drug use: No  . Sexual activity: Not on  file  Other Topics Concern  . Not on file  Social History Narrative  . Not on file   Social Determinants of Health   Financial Resource Strain:   . Difficulty of Paying Living Expenses:   Food Insecurity:   . Worried About Charity fundraiser in the Last Year:   . Arboriculturist in the Last Year:   Transportation Needs:   . Film/video editor (Medical):   Marland Kitchen Lack of Transportation (Non-Medical):   Physical Activity:   . Days of Exercise per Week:   . Minutes of Exercise per Session:   Stress:   . Feeling of Stress :   Social Connections:   . Frequency of Communication  with Friends and Family:   . Frequency of Social Gatherings with Friends and Family:   . Attends Religious Services:   . Active Member of Clubs or Organizations:   . Attends Archivist Meetings:   Marland Kitchen Marital Status:   Intimate Partner Violence:   . Fear of Current or Ex-Partner:   . Emotionally Abused:   Marland Kitchen Physically Abused:   . Sexually Abused:     Family History:  Family History  Problem Relation Age of Onset  . Heart failure Father   . Cancer Sister   . Cancer Other     Medications:   Current Outpatient Medications on File Prior to Visit  Medication Sig Dispense Refill  . acetaminophen (TYLENOL) 500 MG tablet Take 1,000 mg by mouth every 6 (six) hours as needed for mild pain.    . diphenhydrAMINE (BENADRYL) 25 MG tablet 25 mg at bedtime.     Marland Kitchen ibuprofen (ADVIL,MOTRIN) 200 MG tablet Take 200 mg by mouth every 6 (six) hours as needed for mild pain or moderate pain.    . vitamin B-12 1000 MCG tablet Take 1 tablet (1,000 mcg total) by mouth daily. 30 tablet 0   No current facility-administered medications on file prior to visit.       OBJECTIVE:  Physical Exam  Vitals:   04/07/20 1304  BP: 132/76  Pulse: 80  Temp: (!) 97.1 F (36.2 C)  Weight: 195 lb 9.6 oz (88.7 kg)  Height: 5\' 8"  (1.727 m)   Body mass index is 29.74 kg/m. No exam data present  General: well developed, well nourished,  pleasant elderly Caucasian female, seated, in no evident distress Head: head normocephalic and atraumatic.   Neck: supple with no carotid or supraclavicular bruits Cardiovascular: regular rate and rhythm, no murmurs Musculoskeletal: no deformity Skin:  no rash/petichiae Vascular:  Normal pulses all extremities   Neurologic Exam Mental Status: Awake and fully alert.   Normal speech and language.  Oriented to place and time. Recent and remote memory intact. Attention span, concentration and fund of knowledge appropriate. Mood and affect appropriate.  Cranial Nerves:  Fundoscopic exam reveals sharp disc margins. Pupils equal, briskly reactive to light. Extraocular movements full without nystagmus. Visual fields full to confrontation. Hearing intact. Facial sensation intact. Face, tongue, palate moves normally and symmetrically.  Motor: Normal bulk and tone. Normal strength in all tested extremity muscles. Sensory.: intact to touch , pinprick , position and vibratory sensation.  Coordination: Rapid alternating movements normal in all extremities. Finger-to-nose and heel-to-shin performed accurately bilaterally. Gait and Station: Arises from chair without difficulty. Stance is normal. Gait demonstrates normal stride length and balance Reflexes: 1+ and symmetric. Toes downgoing.     NIHSS  0 Modified Rankin  0  ASSESSMENT: Chanequa Kasprzyk is a 84 y.o. year old female presented with left facial numbness, shakiness and blurred vision along with decreased sensation of left face on 02/29/2020 likely TIA with MRI negative for acute infarct. Vascular risk factors include intracranial stenosis, aortic arthrosclerosis, HLD, 3 mm arthrosclerotic aneurysm right ICA, DM and B12 deficiency.      PLAN:  1. TIA:  -Continue clopidogrel 75 mg daily  and recommend initiating Crestor 40 mg daily for secondary stroke prevention.  Refill placed for clopidogrel as requested but advised ongoing prescribing will need to be done by PCP once establishes care -Maintain strict control of hypertension with blood pressure goal below 130/90, diabetes with hemoglobin A1c goal below 6.5% and cholesterol with LDL cholesterol (bad cholesterol) goal below 70 mg/dL.  I also advised the patient to eat a healthy diet with plenty of whole grains, cereals, fruits and vegetables, exercise regularly with at least 30 minutes of continuous activity daily and maintain ideal body weight. 2. HLD: Initiate Crestor 40 mg daily as difficulty tolerating atorvastatin.  Highly encouraged establishing care  with PCP for ongoing monitoring management.  Will repeat lipid panel at follow-up visit if needed 3. DMII: Diet controlled.  He is to establish care with PCP for ongoing monitoring and management 4. B12 deficiency: Continue supplementation 5. Right ICA aneurysm: We will repeat CTA head at follow-up visit for surveillance monitoring   Follow up in 4 months or call earlier if needed   I spent 50 minutes of face-to-face and non-face-to-face time with patient and daughter.  This included previsit chart review, lab review, study review, order entry, electronic health record documentation, patient education regarding recent stroke, residual deficits, importance of managing stroke risk factors and answered all questions to patient satisfaction     [name], [facility]  [facility] [address] [place], [place] [address]  Phone [phone] Fax [phone] Note: This document was prepared with digital dictation and possible smart phrase technology. Any transcriptional errors that result from this process are unintentional.

## 2020-04-07 NOTE — Patient Instructions (Signed)
Continue aspirin 81 mg daily  and recommend starting Crestor 40 mg daily for secondary stroke prevention  We will repeat cholesterol levels at follow-up visit  You may trial co-Q10 200-300mg  daily which may help with statin related muscle aches and pains  Please ensure you establish care with a new PCP for ongoing chronic disease monitoring and management  Continue to monitor blood pressure at home  Maintain strict control of hypertension with blood pressure goal below 130/90, diabetes with hemoglobin A1c goal below 6.5% and cholesterol with LDL cholesterol (bad cholesterol) goal below 70 mg/dL. I also advised the patient to eat a healthy diet with plenty of whole grains, cereals, fruits and vegetables, exercise regularly and maintain ideal body weight.  Followup in the future with me in 4 months or call earlier if needed       Thank you for coming to see [place] at [facility]. I hope we have been able to provide you high quality care today.  You may receive a patient satisfaction survey over the next few weeks. We would appreciate your feedback and comments so that we may continue to improve ourselves and the health of our patients.

## 2020-04-08 ENCOUNTER — Encounter: Payer: Self-pay | Admitting: Adult Health

## 2020-04-08 NOTE — Progress Notes (Signed)
I agree with the above plan 

## 2020-07-27 DIAGNOSIS — H40013 Open angle with borderline findings, low risk, bilateral: Secondary | ICD-10-CM | POA: Diagnosis not present

## 2020-08-10 ENCOUNTER — Ambulatory Visit: Payer: Medicare Other | Admitting: Adult Health

## 2020-08-25 ENCOUNTER — Other Ambulatory Visit: Payer: Self-pay

## 2020-08-25 ENCOUNTER — Encounter: Payer: Self-pay | Admitting: Family Medicine

## 2020-08-25 ENCOUNTER — Ambulatory Visit (INDEPENDENT_AMBULATORY_CARE_PROVIDER_SITE_OTHER): Payer: Medicare Other | Admitting: Family Medicine

## 2020-08-25 VITALS — BP 128/80 | HR 82 | Temp 98.2°F | Ht 68.0 in | Wt 188.8 lb

## 2020-08-25 DIAGNOSIS — R252 Cramp and spasm: Secondary | ICD-10-CM

## 2020-08-25 DIAGNOSIS — N3001 Acute cystitis with hematuria: Secondary | ICD-10-CM | POA: Diagnosis not present

## 2020-08-25 DIAGNOSIS — R7303 Prediabetes: Secondary | ICD-10-CM

## 2020-08-25 DIAGNOSIS — G459 Transient cerebral ischemic attack, unspecified: Secondary | ICD-10-CM | POA: Diagnosis not present

## 2020-08-25 DIAGNOSIS — E782 Mixed hyperlipidemia: Secondary | ICD-10-CM

## 2020-08-25 DIAGNOSIS — R600 Localized edema: Secondary | ICD-10-CM | POA: Diagnosis not present

## 2020-08-25 DIAGNOSIS — M7989 Other specified soft tissue disorders: Secondary | ICD-10-CM

## 2020-08-25 DIAGNOSIS — E538 Deficiency of other specified B group vitamins: Secondary | ICD-10-CM | POA: Diagnosis not present

## 2020-08-25 DIAGNOSIS — R300 Dysuria: Secondary | ICD-10-CM | POA: Diagnosis not present

## 2020-08-25 LAB — BAYER DCA HB A1C WAIVED: HB A1C (BAYER DCA - WAIVED): 5.6 % (ref <7.0)

## 2020-08-25 LAB — URINALYSIS, COMPLETE
Bilirubin, UA: NEGATIVE
Glucose, UA: NEGATIVE
Ketones, UA: NEGATIVE
Nitrite, UA: NEGATIVE
Protein,UA: NEGATIVE
Specific Gravity, UA: 1.01 (ref 1.005–1.030)
Urobilinogen, Ur: 0.2 mg/dL (ref 0.2–1.0)
pH, UA: 5.5 (ref 5.0–7.5)

## 2020-08-25 LAB — MICROSCOPIC EXAMINATION

## 2020-08-25 MED ORDER — LIVALO 2 MG PO TABS: 2.0000 mg | ORAL_TABLET | Freq: Every day | ORAL | 2 refills | Status: DC

## 2020-08-25 MED ORDER — CEFDINIR 300 MG PO CAPS: 300.0000 mg | ORAL_CAPSULE | Freq: Two times a day (BID) | ORAL | 0 refills | Status: AC

## 2020-08-25 NOTE — Progress Notes (Signed)
New Patient Office Visit  Assessment & Plan:  1. Mixed hyperlipidemia - Trying Livalo for statin therapy since patient has been unable to tolerate atorvastatin or rosuvastatin. Encouraged to try taking CoQ10 with statin to help with the pain.  - Pitavastatin Calcium (LIVALO) 2 MG TABS; Take 1 tablet (2 mg total) by mouth daily.  Dispense: 30 tablet; Refill: 2 - CMP14+EGFR - Lipid panel - TSH - Brain natriuretic peptide  2. TIA (transient ischemic attack) - Trying Livalo for statin therapy since patient has been unable to tolerate atorvastatin or rosuvastatin. Encouraged to try taking CoQ10 with statin to help with the pain.  - Pitavastatin Calcium (LIVALO) 2 MG TABS; Take 1 tablet (2 mg total) by mouth daily.  Dispense: 30 tablet; Refill: 2 - CMP14+EGFR - Lipid panel  3. B12 deficiency - Vitamin B12  4. Prediabetes - TSH - Bayer DCA Hb A1c Waived - Brain natriuretic peptide  5. Swelling of lower extremity - CBC with Differential/Platelet - CMP14+EGFR - TSH - Brain natriuretic peptide  6. Localized edema  - Brain natriuretic peptide  7. Leg cramping - CBC with Differential/Platelet - CMP14+EGFR - Magnesium - Vitamin B12 - Brain natriuretic peptide - MAGNESIUM PO; Take 1 tablet by mouth daily.  8. Dysuria - Urinalysis, Complete - Microscopic Examination  9. Acute cystitis with hematuria - cefdinir (OMNICEF) 300 MG capsule; Take 1 capsule (300 mg total) by mouth 2 (two) times daily for 10 days. 1 po BID  Dispense: 20 capsule; Refill: 0 - Urine Culture   Follow-up: Return as directed after labs result.   Hendricks Limes, MSN, APRN, FNP-C [facility]  Subjective:  Patient ID: Debra Olsen, female    DOB: October 07, 1936  Age: 84 y.o. MRN: [id number]  Patient Care Team: Loman Brooklyn, FNP as PCP - General (Family Medicine)  CC:  Chief Complaint  Patient presents with  . New Patient (Initial Visit)    [facility]   . Establish Care    . Dysuria    x 3 months  . Leg Swelling    Patient states she has been having ongoing right leg swelling.    HPI Debra Olsen presents to establish care.   Patient reports dysuria, urgency, and frequency x3 months. She feels it sometimes seems to get a little better, but then comes back.  She also reports swelling in her right leg that has been going on for years. She sometimes gets cramps in it. She has been taking magnesium for a couple of weeks which has helped with the cramping. She has not had any cramps since she started the magnesium and was having them everyday previously.   She takes vitamin B12 due to a deficiency.   She previously took rosuvastatin and atorvastatin, both of which made her legs hurt, so she quit taking them. Rosuvastatin was the most recent.     Review of Systems  Constitutional: Negative for chills, fever, malaise/fatigue and weight loss.  HENT: Negative for congestion, ear discharge, ear pain, nosebleeds, sinus pain, sore throat and tinnitus.   Eyes: Negative for blurred vision, double vision, pain, discharge and redness.  Respiratory: Negative for cough, shortness of breath and wheezing.   Cardiovascular: Negative for chest pain, palpitations and leg swelling.  Gastrointestinal: Negative for abdominal pain, constipation, diarrhea, heartburn, nausea and vomiting.  Genitourinary: Positive for dysuria, frequency and urgency.  Musculoskeletal: Negative for myalgias.  Skin: Negative for rash.  Neurological: Negative for dizziness, seizures, weakness and headaches.  Psychiatric/Behavioral:  Negative for depression, substance abuse and suicidal ideas. The patient is not nervous/anxious.     Current Outpatient Medications:  .  acetaminophen (TYLENOL) 500 MG tablet, Take 1,000 mg by mouth every 6 (six) hours as needed for mild pain., Disp: , Rfl:  .  clopidogrel (PLAVIX) 75 MG tablet, Take 1 tablet (75 mg total) by mouth daily., Disp: 90 tablet, Rfl: 3 .   diphenhydrAMINE (BENADRYL) 25 MG tablet, 25 mg at bedtime. , Disp: , Rfl:  .  ibuprofen (ADVIL,MOTRIN) 200 MG tablet, Take 200 mg by mouth every 6 (six) hours as needed for mild pain or moderate pain., Disp: , Rfl:  .  vitamin B-12 1000 MCG tablet, Take 1 tablet (1,000 mcg total) by mouth daily., Disp: 30 tablet, Rfl: 0  Allergies  Allergen Reactions  . Lidocaine Other (See Comments)    Passed out  . Penicillins Rash    Past Medical History:  Diagnosis Date  . Arthritis   . Diabetes mellitus without complication ([place])   . Diverticulosis of intestine   . GERD (gastroesophageal reflux disease)     Past Surgical History:  Procedure Laterality Date  . ABDOMINAL HYSTERECTOMY      Family History  Problem Relation Age of Onset  . Heart failure Father   . Heart disease Father   . Cancer Sister   . Cancer Other   . Kidney disease Mother   . Diabetes Brother   . Bone cancer Sister   . Breast cancer Sister   . Alzheimer's disease Sister   . Cancer Brother        unknown  . Heart disease Brother     Social History   Socioeconomic History  . Marital status: Married    Spouse name: Not on file  . Number of children: Not on file  . Years of education: Not on file  . Highest education level: Not on file  Occupational History  . Not on file  Tobacco Use  . Smoking status: Former Smoker    Types: Cigarettes  . Smokeless tobacco: Never Used  Vaping Use  . Vaping Use: Never used  Substance and Sexual Activity  . Alcohol use: No  . Drug use: No  . Sexual activity: Not Currently  Other Topics Concern  . Not on file  Social History Narrative  . Not on file   Social Determinants of Health   Financial Resource Strain:   . Difficulty of Paying Living Expenses: Not on file  Food Insecurity:   . Worried About Charity fundraiser in the Last Year: Not on file  . Ran Out of Food in the Last Year: Not on file  Transportation Needs:   . Lack of Transportation (Medical): Not  on file  . Lack of Transportation (Non-Medical): Not on file  Physical Activity:   . Days of Exercise per Week: Not on file  . Minutes of Exercise per Session: Not on file  Stress:   . Feeling of Stress : Not on file  Social Connections:   . Frequency of Communication with Friends and Family: Not on file  . Frequency of Social Gatherings with Friends and Family: Not on file  . Attends Religious Services: Not on file  . Active Member of Clubs or Organizations: Not on file  . Attends Archivist Meetings: Not on file  . Marital Status: Not on file  Intimate Partner Violence:   . Fear of Current or Ex-Partner: Not on file  .  Emotionally Abused: Not on file  . Physically Abused: Not on file  . Sexually Abused: Not on file    Objective:   Today's Vitals: BP 128/80   Pulse 82   Temp 98.2 F (36.8 C) (Temporal)   Ht 5' 8" (1.727 m)   Wt 188 lb 12.8 oz (85.6 kg)   SpO2 93%   BMI 28.71 kg/m   Physical Exam Vitals reviewed.  Constitutional:      General: She is not in acute distress.    Appearance: Normal appearance. She is not ill-appearing, toxic-appearing or diaphoretic.  HENT:     Head: Normocephalic and atraumatic.  Eyes:     General: No scleral icterus.       Right eye: No discharge.        Left eye: No discharge.     Conjunctiva/sclera: Conjunctivae normal.  Cardiovascular:     Rate and Rhythm: Normal rate and regular rhythm.     Heart sounds: Normal heart sounds. No murmur heard.  No friction rub. No gallop.   Pulmonary:     Effort: Pulmonary effort is normal. No respiratory distress.     Breath sounds: Normal breath sounds. No stridor. No wheezing, rhonchi or rales.  Musculoskeletal:        General: Normal range of motion.     Cervical back: Normal range of motion.     Right lower leg: Edema (1+) present.     Left lower leg: Edema (1+) present.  Skin:    General: Skin is warm and dry.     Capillary Refill: Capillary refill takes less than 2 seconds.    Neurological:     General: No focal deficit present.     Mental Status: She is alert and oriented to person, place, and time. Mental status is at baseline.  Psychiatric:        Mood and Affect: Mood normal.        Behavior: Behavior normal.        Thought Content: Thought content normal.        Judgment: Judgment normal.

## 2020-08-25 NOTE — Patient Instructions (Signed)
Try taking CoQ10 with pitavastatin to help with pain.

## 2020-08-26 LAB — CMP14+EGFR
ALT: 9 IU/L (ref 0–32)
AST: 18 IU/L (ref 0–40)
Albumin/Globulin Ratio: 1.3 (ref 1.2–2.2)
Albumin: 4.1 g/dL (ref 3.6–4.6)
Alkaline Phosphatase: 79 IU/L (ref 48–121)
BUN/Creatinine Ratio: 15 (ref 12–28)
BUN: 13 mg/dL (ref 8–27)
Bilirubin Total: 0.2 mg/dL (ref 0.0–1.2)
CO2: 25 mmol/L (ref 20–29)
Calcium: 9.1 mg/dL (ref 8.7–10.3)
Chloride: 104 mmol/L (ref 96–106)
Creatinine, Ser: 0.84 mg/dL (ref 0.57–1.00)
GFR calc Af Amer: 75 mL/min/{1.73_m2} (ref >59)
GFR calc non Af Amer: 65 mL/min/{1.73_m2} (ref >59)
Globulin, Total: 3.1 g/dL (ref 1.5–4.5)
Glucose: 92 mg/dL (ref 65–99)
Potassium: 4.4 mmol/L (ref 3.5–5.2)
Sodium: 141 mmol/L (ref 134–144)
Total Protein: 7.2 g/dL (ref 6.0–8.5)

## 2020-08-26 LAB — CBC WITH DIFFERENTIAL/PLATELET
Basophils Absolute: 0.2 10*3/uL (ref 0.0–0.2)
Basos: 3 %
EOS (ABSOLUTE): 0.3 10*3/uL (ref 0.0–0.4)
Eos: 5 %
Hematocrit: 41.4 % (ref 34.0–46.6)
Hemoglobin: 14 g/dL (ref 11.1–15.9)
Immature Grans (Abs): 0 10*3/uL (ref 0.0–0.1)
Immature Granulocytes: 0 %
Lymphocytes Absolute: 2.3 10*3/uL (ref 0.7–3.1)
Lymphs: 33 %
MCH: 31 pg (ref 26.6–33.0)
MCHC: 33.8 g/dL (ref 31.5–35.7)
MCV: 92 fL (ref 79–97)
Monocytes Absolute: 0.7 10*3/uL (ref 0.1–0.9)
Monocytes: 10 %
Neutrophils Absolute: 3.5 10*3/uL (ref 1.4–7.0)
Neutrophils: 49 %
Platelets: 308 10*3/uL (ref 150–450)
RBC: 4.52 x10E6/uL (ref 3.77–5.28)
RDW: 12.3 % (ref 11.7–15.4)
WBC: 7.1 10*3/uL (ref 3.4–10.8)

## 2020-08-26 LAB — TSH: TSH: 1.91 u[IU]/mL (ref 0.450–4.500)

## 2020-08-26 LAB — LIPID PANEL
Chol/HDL Ratio: 5.1 ratio — ABNORMAL HIGH (ref 0.0–4.4)
Cholesterol, Total: 267 mg/dL — ABNORMAL HIGH (ref 100–199)
HDL: 52 mg/dL (ref >39)
LDL Chol Calc (NIH): 181 mg/dL — ABNORMAL HIGH (ref 0–99)
Triglycerides: 183 mg/dL — ABNORMAL HIGH (ref 0–149)
VLDL Cholesterol Cal: 34 mg/dL (ref 5–40)

## 2020-08-26 LAB — MAGNESIUM: Magnesium: 2.3 mg/dL (ref 1.6–2.3)

## 2020-08-26 LAB — BRAIN NATRIURETIC PEPTIDE: BNP: 46 pg/mL (ref 0.0–100.0)

## 2020-08-26 LAB — VITAMIN B12: Vitamin B-12: >2000 pg/mL — ABNORMAL HIGH (ref 232–1245)

## 2020-08-28 LAB — URINE CULTURE

## 2020-08-30 DIAGNOSIS — R7303 Prediabetes: Secondary | ICD-10-CM | POA: Insufficient documentation

## 2020-08-31 ENCOUNTER — Telehealth: Payer: Self-pay | Admitting: Family Medicine

## 2020-08-31 NOTE — Telephone Encounter (Signed)
Atorvastatin (Lipitor) was the first she tried. Rosuvastatin (Crestor) was the most recent one. Which does she feel she would like to re-try? Tell her to take the CoQ10 with the statin when she does re-try it.

## 2020-08-31 NOTE — Telephone Encounter (Signed)
Pt states that the new cholesterol med was to expensive and she would like to go back to lipitor and requesting this to be sent to [facility].

## 2020-08-31 NOTE — Telephone Encounter (Signed)
Pt aware of provider feedback and voiced understanding. She doesn't have a preference on the statin just whichever one you think would be the cheapest and pt knows to use the CoQ10 with it as well.

## 2020-09-01 MED ORDER — PRAVASTATIN SODIUM 40 MG PO TABS: 40.0000 mg | ORAL_TABLET | Freq: Every day | ORAL | 2 refills | Status: DC

## 2020-09-08 ENCOUNTER — Telehealth: Payer: Self-pay | Admitting: Family Medicine

## 2020-09-08 DIAGNOSIS — N3001 Acute cystitis with hematuria: Secondary | ICD-10-CM

## 2020-09-08 NOTE — Telephone Encounter (Signed)
Pt called stating that she saw Britney on 08/25/20 and was prescribed an antibiotic for UTI at that time. Says since then, she has felt no relief and is still having same issues. Pt did take the antibiotic as directed. Wants to know if Karsten Fells can call something else in for pt or see what she recommends pt do.

## 2020-09-08 NOTE — Telephone Encounter (Signed)
Please review and advise.

## 2020-09-09 MED ORDER — CIPROFLOXACIN HCL 500 MG PO TABS: 500.0000 mg | ORAL_TABLET | Freq: Two times a day (BID) | ORAL | 0 refills | Status: AC

## 2020-09-09 NOTE — Telephone Encounter (Signed)
Patient aware and verbalizes understanding. 

## 2020-09-09 NOTE — Telephone Encounter (Signed)
I sent Cipro for her to take x7 days. If this does not resolve symptoms, she needs to come back in.

## 2020-10-20 ENCOUNTER — Other Ambulatory Visit: Payer: Self-pay

## 2020-10-20 MED ORDER — CLOPIDOGREL BISULFATE 75 MG PO TABS
75.0000 mg | ORAL_TABLET | Freq: Every day | ORAL | 0 refills | Status: DC
Start: 2020-10-20 — End: 2021-01-25

## 2020-12-09 DIAGNOSIS — H401221 Low-tension glaucoma, left eye, mild stage: Secondary | ICD-10-CM | POA: Diagnosis not present

## 2021-01-04 ENCOUNTER — Telehealth: Payer: Self-pay

## 2021-01-04 NOTE — Telephone Encounter (Signed)
Appointment scheduled.

## 2021-01-18 ENCOUNTER — Ambulatory Visit (INDEPENDENT_AMBULATORY_CARE_PROVIDER_SITE_OTHER): Payer: Medicare Other

## 2021-01-18 DIAGNOSIS — Z0000 Encounter for general adult medical examination without abnormal findings: Secondary | ICD-10-CM

## 2021-01-18 NOTE — Patient Instructions (Signed)
  Pax Maintenance Summary and Written Plan of Care  Ms. Vanmeter ,  Thank you for allowing me to perform your Medicare Annual Wellness Visit and for your ongoing commitment to your health.   Health Maintenance & Immunization History Health Maintenance  Topic Date Due  . COVID-19 Vaccine (1) Never done  . URINE MICROALBUMIN  Never done  . TETANUS/TDAP  Never done  . DEXA SCAN  Never done  . PNA vac Low Risk Adult (2 of 2 - PPSV23) 10/04/2016  . INFLUENZA VACCINE  Never done   Immunization History  Administered Date(s) Administered  . Pneumococcal Conjugate-13 10/05/2015    These are the patient goals that we discussed: Goals Addressed   None      This is a list of Health Maintenance Items that are overdue or due now: Health Maintenance Due  Topic Date Due  . COVID-19 Vaccine (1) Never done  . URINE MICROALBUMIN  Never done  . TETANUS/TDAP  Never done  . DEXA SCAN  Never done  . PNA vac Low Risk Adult (2 of 2 - PPSV23) 10/04/2016  . INFLUENZA VACCINE  Never done     Orders/Referrals Placed Today: No orders of the defined types were placed in this encounter.  (Contact our referral department at [phone] if you have not spoken with someone about your referral appointment within the next 5 days)    Follow-up Plan  Scheduled with Hendricks Limes, NP on 01/25/21 at 1:35pm

## 2021-01-18 NOTE — Progress Notes (Signed)
MEDICARE ANNUAL WELLNESS VISIT  01/18/2021  Telephone Visit Disclaimer This Medicare AWV was conducted by telephone due to national recommendations for restrictions regarding the COVID-19 Pandemic (e.g. social distancing).  I verified, using two identifiers, that I am speaking with Debra Olsen or their authorized healthcare agent. I discussed the limitations, risks, security, and privacy concerns of performing an evaluation and management service by telephone and the potential availability of an in-person appointment in the future. The patient expressed understanding and agreed to proceed.  Location of Patient: Home Location of Provider (nurse):  [facility]  Subjective:    Debra Olsen is a 85 y.o. female patient of Loman Brooklyn, FNP who had a Medicare Annual Wellness Visit today via telephone. Debra Olsen is Retired and lives with their spouse. she has five children. she reports that she is socially active and does interact with friends/family regularly. she is not physically active and enjoys taking care of her birds and dog.  Patient Care Team: Loman Brooklyn, FNP as PCP - General (Family Medicine)  Advanced Directives 01/18/2021 02/29/2020 03/08/2018 03/25/2016 04/05/2015 10/06/2014  Does Patient Have a Medical Advance Directive? Yes No No No No No  Type of Paramedic of [place];Living will - - - - -  Does patient want to make changes to medical advance directive? No - Patient declined - - - - -  Would patient like information on creating a medical advance directive? - No - Patient declined - No - patient declined information No - patient declined information -    Hospital Utilization Over the Past 12 Months: # of hospitalizations or ER visits: 1 # of surgeries: 1  Review of Systems    Patient reports that her overall health is unchanged compared to last year.  Patient Reported Readings (BP, Pulse, CBG, Weight, etc) none  Pain  Assessment Pain : No/denies pain     Current Medications & Allergies (verified) Allergies as of 01/18/2021      Reactions   Lidocaine Other (See Comments)   Passed out   Penicillins Rash      Medication List       Accurate as of January 18, 2021  9:08 AM. If you have any questions, ask your nurse or doctor.        acetaminophen 500 MG tablet Commonly known as: TYLENOL Take 1,000 mg by mouth every 6 (six) hours as needed for mild pain.   clopidogrel 75 MG tablet Commonly known as: PLAVIX Take 1 tablet (75 mg total) by mouth daily.   cyanocobalamin 1000 MCG tablet Take 1 tablet (1,000 mcg total) by mouth daily.   MAGNESIUM PO Take 1 tablet by mouth daily.   multivitamin tablet Take 1 tablet by mouth daily.   pravastatin 40 MG tablet Commonly known as: PRAVACHOL Take 1 tablet (40 mg total) by mouth daily.       History (reviewed): Past Medical History:  Diagnosis Date  . Arthritis   . Diverticulosis of intestine   . GERD (gastroesophageal reflux disease)   . Mixed hyperlipidemia   . Prediabetes   . TIA (transient ischemic attack)   . Vitamin B12 deficiency    Past Surgical History:  Procedure Laterality Date  . ABDOMINAL HYSTERECTOMY     Family History  Problem Relation Age of Onset  . Heart failure Father   . Heart disease Father   . Cancer Sister   . Cancer Other   . Kidney disease Mother   .  Diabetes Brother   . Bone cancer Sister   . Breast cancer Sister   . Alzheimer's disease Sister   . Cancer Brother        unknown  . Heart disease Brother    Social History   Socioeconomic History  . Marital status: Married    Spouse name: Not on file  . Number of children: Not on file  . Years of education: Not on file  . Highest education level: Not on file  Occupational History  . Not on file  Tobacco Use  . Smoking status: Former Smoker    Types: Cigarettes  . Smokeless tobacco: Never Used  Vaping Use  . Vaping Use: Never used  Substance  and Sexual Activity  . Alcohol use: No  . Drug use: No  . Sexual activity: Not Currently  Other Topics Concern  . Not on file  Social History Narrative  . Not on file   Social Determinants of Health   Financial Resource Strain: Not on file  Food Insecurity: Not on file  Transportation Needs: Not on file  Physical Activity: Not on file  Stress: Not on file  Social Connections: Not on file    Activities of Daily Living In your present state of health, do you have any difficulty performing the following activities: 01/18/2021 02/29/2020  Hearing? N N  Vision? N N  Difficulty concentrating or making decisions? N N  Walking or climbing stairs? N Y  Dressing or bathing? N N  Doing errands, shopping? N N  Preparing Food and eating ? N -  Using the Toilet? N -  In the past six months, have you accidently leaked urine? N -  Do you have problems with loss of bowel control? N -  Managing your Medications? N -  Managing your Finances? N -  Housekeeping or managing your Housekeeping? N -  Some recent data might be hidden    Patient Education/ Literacy What is the last grade level you completed in school?: GED  Exercise Current Exercise Habits: The patient does not participate in regular exercise at present  Diet Patient reports consuming 3 meals a day and 2 snack(s) a day Patient reports that her primary diet is: Regular Patient reports that she does have regular access to food.   Depression Screen PHQ 2/9 Scores 01/18/2021 08/25/2020  PHQ - 2 Score 0 0     Fall Risk Fall Risk  01/18/2021 08/25/2020  Falls in the past year? 0 0     Objective:  Debra Olsen seemed alert and oriented and she participated appropriately during our telephone visit.  Blood Pressure Weight BMI  BP Readings from Last 3 Encounters:  08/25/20 128/80  04/07/20 132/76  03/01/20 126/74   Wt Readings from Last 3 Encounters:  08/25/20 188 lb 12.8 oz (85.6 kg)  04/07/20 195 lb 9.6 oz (88.7 kg)   02/29/20 195 lb (88.5 kg)   BMI Readings from Last 1 Encounters:  08/25/20 28.71 kg/m    *Unable to obtain current vital signs, weight, and BMI due to telephone visit type  Hearing/Vision  . Debra Olsen did not seem to have difficulty with hearing/understanding during the telephone conversation . Reports that she has had a formal eye exam by an eye care professional within the past year . Reports that she has not had a formal hearing evaluation within the past year *Unable to fully assess hearing and vision during telephone visit type  Cognitive Function: 6CIT Screen 01/18/2021  What Year?  0 points  What month? 0 points  What time? 3 points  Count back from 20 0 points  Months in reverse 0 points  Repeat phrase 10 points  Total Score 13   (Normal:0-7, Significant for Dysfunction: >8)  Normal Cognitive Function Screening: No: <8   Immunization & Health Maintenance Record Immunization History  Administered Date(s) Administered  . Pneumococcal Conjugate-13 10/05/2015    Health Maintenance  Topic Date Due  . COVID-19 Vaccine (1) Never done  . URINE MICROALBUMIN  Never done  . TETANUS/TDAP  Never done  . DEXA SCAN  Never done  . PNA vac Low Risk Adult (2 of 2 - PPSV23) 10/04/2016  . INFLUENZA VACCINE  Never done       Assessment  This is a routine wellness examination for Debra Olsen.  Health Maintenance: Due or Overdue Health Maintenance Due  Topic Date Due  . COVID-19 Vaccine (1) Never done  . URINE MICROALBUMIN  Never done  . TETANUS/TDAP  Never done  . DEXA SCAN  Never done  . PNA vac Low Risk Adult (2 of 2 - PPSV23) 10/04/2016  . INFLUENZA VACCINE  Never done    Debra Olsen does not need a referral for Community Assistance: Care Management:   no Social Work:    no Prescription Assistance:  no Nutrition/Diabetes Education:  no   Plan:  Personalized Goals  Increase physical activity  Personalized Health Maintenance & Screening Recommendations   Pneumococcal vaccine , Tdap  Lung Cancer Screening Recommended: no (Low Dose CT Chest recommended if Age 51-80 years, 30 pack-year currently smoking OR have quit w/in past 15 years) Hepatitis C Screening recommended: no HIV Screening recommended: no  Advanced Directives: Written information was not prepared per patient's request.  Referrals & Orders No orders of the defined types were placed in this encounter.   Follow-up Plan . Follow-up with Loman Brooklyn, FNP as planned . Schedule 01/25/21   I have personally reviewed and noted the following in the patient's chart:   . Medical and social history . Use of alcohol, tobacco or illicit drugs  . Current medications and supplements . Functional ability and status . Nutritional status . Physical activity . Advanced directives . List of other physicians . Hospitalizations, surgeries, and ER visits in previous 12 months . Vitals . Screenings to include cognitive, depression, and falls . Referrals and appointments  In addition, I have reviewed and discussed with Debra Olsen certain preventive protocols, quality metrics, and best practice recommendations. A written personalized care plan for preventive services as well as general preventive health recommendations is available and can be mailed to the patient at her request.      Maud Deed [facility]  9/89/2119

## 2021-01-24 DIAGNOSIS — H401221 Low-tension glaucoma, left eye, mild stage: Secondary | ICD-10-CM | POA: Diagnosis not present

## 2021-01-25 ENCOUNTER — Encounter: Payer: Self-pay | Admitting: Family Medicine

## 2021-01-25 ENCOUNTER — Other Ambulatory Visit: Payer: Self-pay

## 2021-01-25 ENCOUNTER — Ambulatory Visit (INDEPENDENT_AMBULATORY_CARE_PROVIDER_SITE_OTHER): Payer: Medicare Other | Admitting: Family Medicine

## 2021-01-25 VITALS — BP 115/69 | HR 78 | Temp 97.8°F | Ht 68.0 in | Wt 186.0 lb

## 2021-01-25 DIAGNOSIS — G459 Transient cerebral ischemic attack, unspecified: Secondary | ICD-10-CM

## 2021-01-25 DIAGNOSIS — R7303 Prediabetes: Secondary | ICD-10-CM

## 2021-01-25 DIAGNOSIS — N390 Urinary tract infection, site not specified: Secondary | ICD-10-CM | POA: Diagnosis not present

## 2021-01-25 DIAGNOSIS — R300 Dysuria: Secondary | ICD-10-CM

## 2021-01-25 DIAGNOSIS — E782 Mixed hyperlipidemia: Secondary | ICD-10-CM

## 2021-01-25 DIAGNOSIS — Z780 Asymptomatic menopausal state: Secondary | ICD-10-CM | POA: Diagnosis not present

## 2021-01-25 DIAGNOSIS — E538 Deficiency of other specified B group vitamins: Secondary | ICD-10-CM | POA: Diagnosis not present

## 2021-01-25 LAB — URINALYSIS, COMPLETE
Bilirubin, UA: NEGATIVE
Glucose, UA: NEGATIVE
Ketones, UA: NEGATIVE
Nitrite, UA: NEGATIVE
Protein,UA: NEGATIVE
Specific Gravity, UA: 1.015 (ref 1.005–1.030)
Urobilinogen, Ur: 0.2 mg/dL (ref 0.2–1.0)
pH, UA: 6.5 (ref 5.0–7.5)

## 2021-01-25 LAB — MICROSCOPIC EXAMINATION

## 2021-01-25 LAB — BAYER DCA HB A1C WAIVED: HB A1C (BAYER DCA - WAIVED): 5.6 % (ref <7.0)

## 2021-01-25 MED ORDER — CLOPIDOGREL BISULFATE 75 MG PO TABS: 75.0000 mg | ORAL_TABLET | Freq: Every day | ORAL | 3 refills | Status: DC

## 2021-01-25 MED ORDER — CIPROFLOXACIN HCL 500 MG PO TABS: 500.0000 mg | ORAL_TABLET | Freq: Two times a day (BID) | ORAL | 0 refills | Status: DC

## 2021-01-25 NOTE — Patient Instructions (Addendum)
Urinary Tract Infection, Adult A urinary tract infection (UTI) is an infection of any part of the urinary tract. The urinary tract includes:  The kidneys.  The ureters.  The bladder.  The urethra. These organs make, store, and get rid of pee (urine) in the body. What are the causes? This infection is caused by germs (bacteria) in your genital area. These germs grow and cause swelling (inflammation) of your urinary tract. What increases the risk? The following factors may make you more likely to develop this condition:  Using a small, thin tube (catheter) to drain pee.  Not being able to control when you pee or poop (incontinence).  Being female. If you are female, these things can increase the risk: ? Using these methods to prevent pregnancy:  A medicine that kills sperm (spermicide).  A device that blocks sperm (diaphragm). ? Having low levels of a female hormone (estrogen). ? Being pregnant. You are more likely to develop this condition if:  You have genes that add to your risk.  You are sexually active.  You take antibiotic medicines.  You have trouble peeing because of: ? A prostate that is bigger than normal, if you are female. ? A blockage in the part of your body that drains pee from the bladder. ? A kidney stone. ? A nerve condition that affects your bladder. ? Not getting enough to drink. ? Not peeing often enough.  You have other conditions, such as: ? Diabetes. ? A weak disease-fighting system (immune system). ? Sickle cell disease. ? Gout. ? Injury of the spine. What are the signs or symptoms? Symptoms of this condition include:  Needing to pee right away.  Peeing small amounts often.  Pain or burning when peeing.  Blood in the pee.  Pee that smells bad or not like normal.  Trouble peeing.  Pee that is cloudy.  Fluid coming from the vagina, if you are female.  Pain in the belly or lower back. Other symptoms include:  Vomiting.  Not  feeling hungry.  Feeling mixed up (confused). This may be the first symptom in older adults.  Being tired and grouchy (irritable).  A fever.  Watery poop (diarrhea). How is this treated?  Taking antibiotic medicine.  Taking other medicines.  Drinking enough water. In some cases, you may need to see a specialist. Follow these instructions at home: Medicines  Take over-the-counter and prescription medicines only as told by your doctor.  If you were prescribed an antibiotic medicine, take it as told by your doctor. Do not stop taking it even if you start to feel better. General instructions  Make sure you: ? Pee until your bladder is empty. ? Do not hold pee for a long time. ? Empty your bladder after sex. ? Wipe from front to back after peeing or pooping if you are a female. Use each tissue one time when you wipe.  Drink enough fluid to keep your pee pale yellow.  Keep all follow-up visits.   Contact a doctor if:  You do not get better after 1-2 days.  Your symptoms go away and then come back. Get help right away if:  You have very bad back pain.  You have very bad pain in your lower belly.  You have a fever.  You have chills.  You feeling like you will vomit or you vomit. Summary  A urinary tract infection (UTI) is an infection of any part of the urinary tract.  This condition is caused by   germs in your genital area.  There are many risk factors for a UTI.  Treatment includes antibiotic medicines.  Drink enough fluid to keep your pee pale yellow. This information is not intended to replace advice given to you by your health care provider. Make sure you discuss any questions you have with your health care provider. Document Revised: 07/23/2020 Document Reviewed: 07/23/2020 Elsevier Patient Education  2021 [employer].    High Cholesterol  High cholesterol is a condition in which the blood has high levels of a white, waxy substance similar to fat  (cholesterol). The liver makes all the cholesterol that the body needs. The human body needs small amounts of cholesterol to help build cells. A person gets extra or excess cholesterol from the food that he or she eats. The blood carries cholesterol from the liver to the rest of the body. If you have high cholesterol, deposits (plaques) may build up on the walls of your arteries. Arteries are the blood vessels that carry blood away from your heart. These plaques make the arteries narrow and stiff. Cholesterol plaques increase your risk for heart attack and stroke. Work with your health care provider to keep your cholesterol levels in a healthy range. What increases the risk? The following factors may make you more likely to develop this condition:  Eating foods that are high in animal fat (saturated fat) or cholesterol.  Being overweight.  Not getting enough exercise.  A family history of high cholesterol (familial hypercholesterolemia).  Use of tobacco products.  Having diabetes. What are the signs or symptoms? There are no symptoms of this condition. How is this diagnosed? This condition may be diagnosed based on the results of a blood test.  If you are older than 83 years of age, your health care provider may check your cholesterol levels every 4-6 years.  You may be checked more often if you have high cholesterol or other risk factors for heart disease. The blood test for cholesterol measures:  "Bad" cholesterol, or LDL cholesterol. This is the main type of cholesterol that causes heart disease. The desired level is less than 100 mg/dL.  "Good" cholesterol, or HDL cholesterol. HDL helps protect against heart disease by cleaning the arteries and carrying the LDL to the liver for processing. The desired level for HDL is 60 mg/dL or higher.  Triglycerides. These are fats that your body can store or burn for energy. The desired level is less than 150 mg/dL.  Total cholesterol. This  measures the total amount of cholesterol in your blood and includes LDL, HDL, and triglycerides. The desired level is less than 200 mg/dL. How is this treated? This condition may be treated with:  Diet changes. You may be asked to eat foods that have more fiber and less saturated fats or added sugar.  Lifestyle changes. These may include regular exercise, maintaining a healthy weight, and quitting use of tobacco products.  Medicines. These are given when diet and lifestyle changes have not worked. You may be prescribed a statin medicine to help lower your cholesterol levels. Follow these instructions at home: Eating and drinking  Eat a healthy, balanced diet. This diet includes: ? Daily servings of a variety of fresh, frozen, or canned fruits and vegetables. ? Daily servings of whole grain foods that are rich in fiber. ? Foods that are low in saturated fats and trans fats. These include poultry and fish without skin, lean cuts of meat, and low-fat dairy products. ? A variety of fish,  especially oily fish that contain omega-3 fatty acids. Aim to eat fish at least 2 times a week.  Avoid foods and drinks that have added sugar.  Use healthy cooking methods, such as roasting, grilling, broiling, baking, poaching, steaming, and stir-frying. Do not fry your food except for stir-frying.   Lifestyle  Get regular exercise. Aim to exercise for a total of 150 minutes a week. Increase your activity level by doing activities such as gardening, walking, and taking the stairs.  Do not use any products that contain nicotine or tobacco, such as cigarettes, e-cigarettes, and chewing tobacco. If you need help quitting, ask your health care provider.   General instructions  Take over-the-counter and prescription medicines only as told by your health care provider.  Keep all follow-up visits as told by your health care provider. This is important. Where to find more information  American Heart  Association: www.heart.org  National Heart, Lung, and Blood Institute: https://wilson-eaton.com/ Contact a health care provider if:  You have trouble achieving or maintaining a healthy diet or weight.  You are starting an exercise program.  You are unable to stop smoking. Get help right away if:  You have chest pain.  You have trouble breathing.  You have any symptoms of a stroke. "BE FAST" is an easy way to remember the main warning signs of a stroke: ? B - Balance. Signs are dizziness, sudden trouble walking, or loss of balance. ? E - Eyes. Signs are trouble seeing or a sudden change in vision. ? F - Face. Signs are sudden weakness or numbness of the face, or the face or eyelid drooping on one side. ? A - Arms. Signs are weakness or numbness in an arm. This happens suddenly and usually on one side of the body. ? S - Speech. Signs are sudden trouble speaking, slurred speech, or trouble understanding what people say. ? T - Time. Time to call emergency services. Write down what time symptoms started.  You have other signs of a stroke, such as: ? A sudden, severe headache with no known cause. ? Nausea or vomiting. ? Seizure. These symptoms may represent a serious problem that is an emergency. Do not wait to see if the symptoms will go away. Get medical help right away. Call your local emergency services (911 in the [place] Do not drive yourself to the hospital. Summary  Cholesterol plaques increase your risk for heart attack and stroke. Work with your health care provider to keep your cholesterol levels in a healthy range.  Eat a healthy, balanced diet, get regular exercise, and maintain a healthy weight.  Do not use any products that contain nicotine or tobacco, such as cigarettes, e-cigarettes, and chewing tobacco.  Get help right away if you have any symptoms of a stroke. This information is not intended to replace advice given to you by your health care provider. Make sure you discuss  any questions you have with your health care provider. Document Revised: 11/10/2019 Document Reviewed: 11/10/2019 Elsevier Patient Education  2021 [employer].

## 2021-01-25 NOTE — Progress Notes (Signed)
Assessment & Plan:  1. Prediabetes - Labs to assess. - Microalbumin / creatinine urine ratio - Bayer DCA Hb A1c Waived  2. Mixed hyperlipidemia - Encouraged patient to try statin just once weekly.  She does not wish to do that at this time and prefers to wait until her labs result as she does not feel she still has high cholesterol. - Lipid panel - CBC with Differential/Platelet - CMP14+EGFR  3. TIA (transient ischemic attack) - Continue Plavix.  Encourage statin. - clopidogrel (PLAVIX) 75 MG tablet; Take 1 tablet (75 mg total) by mouth daily.  Dispense: 90 tablet; Refill: 3  4. B12 deficiency - Well controlled on current regimen.   5. Dysuria - Urinalysis, Complete  6. Recurrent UTI - Ambulatory referral to Urology - ciprofloxacin (CIPRO) 500 MG tablet; Take 1 tablet (500 mg total) by mouth 2 (two) times daily for 7 days.  Dispense: 14 tablet; Refill: 0  7. Postmenopausal estrogen deficiency - DG [facility] DEXA; Future   Return in about 6 months (around 07/25/2021) for follow-up of chronic medication conditions.  Hendricks Limes, MSN, APRN, FNP-C [facility]  Subjective:    Patient ID: Debra Olsen, female    DOB: Dec 03, 1936, 85 y.o.   MRN: [id number]  Patient Care Team: Loman Brooklyn, FNP as PCP - General (Family Medicine)   Chief Complaint:  Chief Complaint  Patient presents with  . Hyperlipidemia  . Prediabetes    Check up of chronic medical conditions     HPI: Debra Olsen is a 85 y.o. female presenting on 01/25/2021 for Hyperlipidemia and Prediabetes (Check up of chronic medical conditions/)  Hyperlipidemia/History of TIA: At our last visit pravastatin was prescribed for patient to try as she was intolerant to atorvastatin and rosuvastatin.  She was encouraged to take co-Q10 to help with pains.  Today she reports she did try it with the co-Q10 and did not tolerated either.  She states she is taking garlic cholesterol.  New  complaints: Patient reports she has had dysuria since our last visit 5 months ago.  She was treated with cefdinir twice daily x10 days which the culture was sensitive to.  She also reports urgency and frequency.   Social history:  Relevant past medical, surgical, family and social history reviewed and updated as indicated. Interim medical history since our last visit reviewed.  Allergies and medications reviewed and updated.  DATA REVIEWED: CHART IN EPIC  ROS: Negative unless specifically indicated above in HPI.    Current Outpatient Medications:  .  acetaminophen (TYLENOL) 500 MG tablet, Take 1,000 mg by mouth every 6 (six) hours as needed for mild pain., Disp: , Rfl:  .  clopidogrel (PLAVIX) 75 MG tablet, Take 1 tablet (75 mg total) by mouth daily., Disp: 90 tablet, Rfl: 0 .  MAGNESIUM PO, Take 1 tablet by mouth daily., Disp: , Rfl:  .  Multiple Vitamin (MULTIVITAMIN) tablet, Take 1 tablet by mouth daily., Disp: , Rfl:  .  pravastatin (PRAVACHOL) 40 MG tablet, Take 1 tablet (40 mg total) by mouth daily., Disp: 30 tablet, Rfl: 2 .  vitamin B-12 1000 MCG tablet, Take 1 tablet (1,000 mcg total) by mouth daily., Disp: 30 tablet, Rfl: 0   Allergies  Allergen Reactions  . Lidocaine Other (See Comments)    Passed out  . Penicillins Rash   Past Medical History:  Diagnosis Date  . Arthritis   . Diverticulosis of intestine   . GERD (gastroesophageal reflux disease)   .  Mixed hyperlipidemia   . Prediabetes   . TIA (transient ischemic attack)   . Vitamin B12 deficiency     Past Surgical History:  Procedure Laterality Date  . ABDOMINAL HYSTERECTOMY      Social History   Socioeconomic History  . Marital status: Married    Spouse name: Not on file  . Number of children: Not on file  . Years of education: Not on file  . Highest education level: Not on file  Occupational History  . Not on file  Tobacco Use  . Smoking status: Former Smoker    Types: Cigarettes  . Smokeless  tobacco: Never Used  Vaping Use  . Vaping Use: Never used  Substance and Sexual Activity  . Alcohol use: No  . Drug use: No  . Sexual activity: Not Currently  Other Topics Concern  . Not on file  Social History Narrative  . Not on file   Social Determinants of Health   Financial Resource Strain: Not on file  Food Insecurity: Not on file  Transportation Needs: Not on file  Physical Activity: Not on file  Stress: Not on file  Social Connections: Not on file  Intimate Partner Violence: Not on file        Objective:    BP 115/69   Pulse 78   Temp 97.8 F (36.6 C) (Temporal)   Ht $R'5\' 8"'Nf$  (1.727 m)   Wt 186 lb (84.4 kg)   SpO2 97%   BMI 28.28 kg/m   Wt Readings from Last 3 Encounters:  08/25/20 188 lb 12.8 oz (85.6 kg)  04/07/20 195 lb 9.6 oz (88.7 kg)  02/29/20 195 lb (88.5 kg)    Physical Exam Vitals reviewed.  Constitutional:      General: She is not in acute distress.    Appearance: Normal appearance. She is overweight. She is not ill-appearing, toxic-appearing or diaphoretic.  HENT:     Head: Normocephalic and atraumatic.  Eyes:     General: No scleral icterus.       Right eye: No discharge.        Left eye: No discharge.     Conjunctiva/sclera: Conjunctivae normal.  Cardiovascular:     Rate and Rhythm: Normal rate and regular rhythm.     Heart sounds: Normal heart sounds. No murmur heard. No friction rub. No gallop.   Pulmonary:     Effort: Pulmonary effort is normal. No respiratory distress.     Breath sounds: Normal breath sounds. No stridor. No wheezing, rhonchi or rales.  Musculoskeletal:        General: Normal range of motion.     Cervical back: Normal range of motion.  Skin:    General: Skin is warm and dry.     Capillary Refill: Capillary refill takes less than 2 seconds.  Neurological:     General: No focal deficit present.     Mental Status: She is alert and oriented to person, place, and time. Mental status is at baseline.  Psychiatric:         Mood and Affect: Mood normal.        Behavior: Behavior normal.        Thought Content: Thought content normal.        Judgment: Judgment normal.     Lab Results  Component Value Date   TSH 1.910 08/25/2020   Lab Results  Component Value Date   WBC 7.1 08/25/2020   HGB 14.0 08/25/2020   HCT 41.4 08/25/2020  MCV 92 08/25/2020   PLT 308 08/25/2020   Lab Results  Component Value Date   NA 141 08/25/2020   K 4.4 08/25/2020   CO2 25 08/25/2020   GLUCOSE 92 08/25/2020   BUN 13 08/25/2020   CREATININE 0.84 08/25/2020   BILITOT 0.2 08/25/2020   ALKPHOS 79 08/25/2020   AST 18 08/25/2020   ALT 9 08/25/2020   PROT 7.2 08/25/2020   ALBUMIN 4.1 08/25/2020   CALCIUM 9.1 08/25/2020   ANIONGAP 8 02/29/2020   Lab Results  Component Value Date   CHOL 267 (H) 08/25/2020   Lab Results  Component Value Date   HDL 52 08/25/2020   Lab Results  Component Value Date   LDLCALC 181 (H) 08/25/2020   Lab Results  Component Value Date   TRIG 183 (H) 08/25/2020   Lab Results  Component Value Date   CHOLHDL 5.1 (H) 08/25/2020   Lab Results  Component Value Date   HGBA1C 5.6 08/25/2020

## 2021-01-26 ENCOUNTER — Other Ambulatory Visit: Payer: Self-pay | Admitting: Family Medicine

## 2021-01-26 DIAGNOSIS — E782 Mixed hyperlipidemia: Secondary | ICD-10-CM

## 2021-01-26 DIAGNOSIS — G459 Transient cerebral ischemic attack, unspecified: Secondary | ICD-10-CM

## 2021-01-26 LAB — LIPID PANEL
Chol/HDL Ratio: 4.4 ratio (ref 0.0–4.4)
Cholesterol, Total: 237 mg/dL — ABNORMAL HIGH (ref 100–199)
HDL: 54 mg/dL (ref >39)
LDL Chol Calc (NIH): 158 mg/dL — ABNORMAL HIGH (ref 0–99)
Triglycerides: 137 mg/dL (ref 0–149)
VLDL Cholesterol Cal: 25 mg/dL (ref 5–40)

## 2021-01-26 LAB — CMP14+EGFR
ALT: 10 IU/L (ref 0–32)
AST: 16 IU/L (ref 0–40)
Albumin/Globulin Ratio: 1.3 (ref 1.2–2.2)
Albumin: 3.8 g/dL (ref 3.6–4.6)
Alkaline Phosphatase: 81 IU/L (ref 44–121)
BUN/Creatinine Ratio: 18 (ref 12–28)
BUN: 15 mg/dL (ref 8–27)
Bilirubin Total: 0.3 mg/dL (ref 0.0–1.2)
CO2: 25 mmol/L (ref 20–29)
Calcium: 8.8 mg/dL (ref 8.7–10.3)
Chloride: 105 mmol/L (ref 96–106)
Creatinine, Ser: 0.82 mg/dL (ref 0.57–1.00)
GFR calc Af Amer: 77 mL/min/{1.73_m2} (ref >59)
GFR calc non Af Amer: 67 mL/min/{1.73_m2} (ref >59)
Globulin, Total: 3 g/dL (ref 1.5–4.5)
Glucose: 102 mg/dL — ABNORMAL HIGH (ref 65–99)
Potassium: 4.3 mmol/L (ref 3.5–5.2)
Sodium: 143 mmol/L (ref 134–144)
Total Protein: 6.8 g/dL (ref 6.0–8.5)

## 2021-01-26 LAB — CBC WITH DIFFERENTIAL/PLATELET
Basophils Absolute: 0.2 10*3/uL (ref 0.0–0.2)
Basos: 3 %
EOS (ABSOLUTE): 0.3 10*3/uL (ref 0.0–0.4)
Eos: 4 %
Hematocrit: 39.5 % (ref 34.0–46.6)
Hemoglobin: 13.7 g/dL (ref 11.1–15.9)
Immature Grans (Abs): 0 10*3/uL (ref 0.0–0.1)
Immature Granulocytes: 0 %
Lymphocytes Absolute: 2.1 10*3/uL (ref 0.7–3.1)
Lymphs: 33 %
MCH: 31.6 pg (ref 26.6–33.0)
MCHC: 34.7 g/dL (ref 31.5–35.7)
MCV: 91 fL (ref 79–97)
Monocytes Absolute: 0.7 10*3/uL (ref 0.1–0.9)
Monocytes: 10 %
Neutrophils Absolute: 3.2 10*3/uL (ref 1.4–7.0)
Neutrophils: 50 %
Platelets: 314 10*3/uL (ref 150–450)
RBC: 4.33 x10E6/uL (ref 3.77–5.28)
RDW: 11.9 % (ref 11.7–15.4)
WBC: 6.4 10*3/uL (ref 3.4–10.8)

## 2021-01-26 LAB — MICROALBUMIN / CREATININE URINE RATIO
Creatinine, Urine: 83.2 mg/dL
Microalb/Creat Ratio: 57 mg/g creat — ABNORMAL HIGH (ref 0–29)
Microalbumin, Urine: 47.3 ug/mL

## 2021-01-26 MED ORDER — PRAVASTATIN SODIUM 40 MG PO TABS: 40.0000 mg | ORAL_TABLET | ORAL | 1 refills | Status: DC

## 2021-01-27 ENCOUNTER — Other Ambulatory Visit: Payer: Self-pay | Admitting: Family Medicine

## 2021-01-27 DIAGNOSIS — N390 Urinary tract infection, site not specified: Secondary | ICD-10-CM

## 2021-01-27 LAB — URINE CULTURE

## 2021-01-27 MED ORDER — CEFUROXIME AXETIL 500 MG PO TABS
500.0000 mg | ORAL_TABLET | Freq: Two times a day (BID) | ORAL | 0 refills | Status: AC
Start: 2021-01-27 — End: 2021-02-03

## 2021-03-03 ENCOUNTER — Ambulatory Visit: Payer: Medicare Other | Admitting: Urology

## 2021-03-17 IMAGING — MR MR HEAD W/O CM
12 of 13 series · 44 of 48 positions shown · non-contrast
Comparison: Prior CTs from earlier the same day.

CLINICAL DATA: Initial evaluation for focal neural deficit, stroke
suspected.

EXAM:
MRI HEAD WITHOUT CONTRAST
TECHNIQUE: Multiplanar, multiecho pulse sequences of the brain and surrounding
structures were obtained without intravenous contrast.

[Series 5: DWI · axial · 3.0mm · 0.88mm/px · z∈[-24,+110]mm · 7 of 92 slices shown (1 of 4)]
[im 1/92]
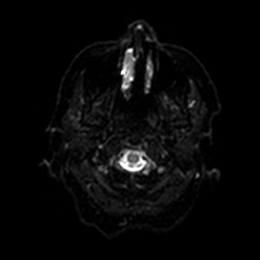
[im 16/92]
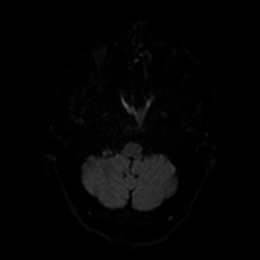
[im 31/92]
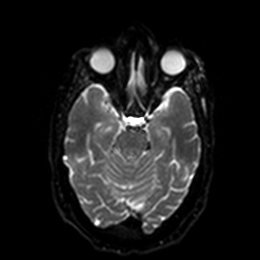
[im 46/92]
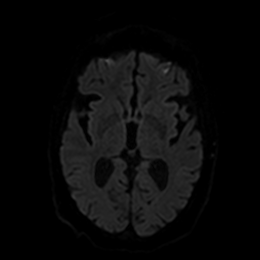
[im 61/92]
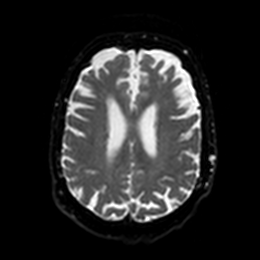
[im 76/92]
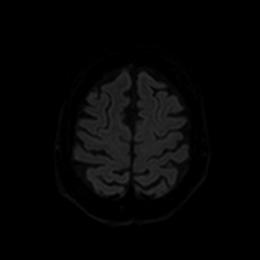
[im 92/92]
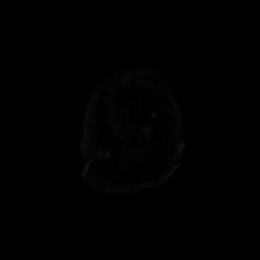

[Series 6: DWI · axial · 3.0mm · 0.88mm/px · z∈[-24,+110]mm · 4 of 46 slices shown (2 of 4)]
[im 1/46]
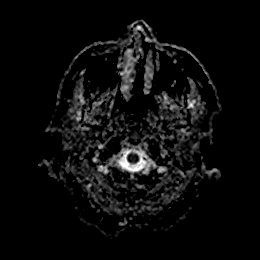
[im 16/46]
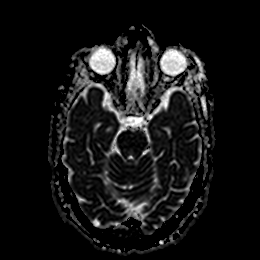
[im 31/46]
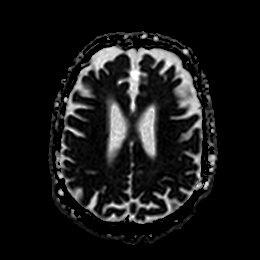
[im 46/46]
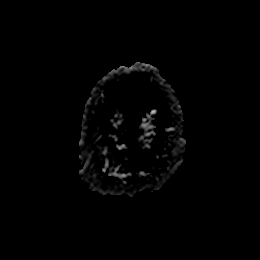

[Series 7: T1 · sagittal · 5.0mm · 0.75mm/px · 2 of 25 slices shown]
[im 1/25]
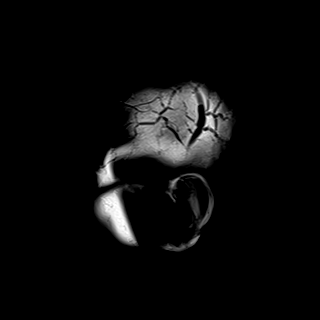
[im 25/25]
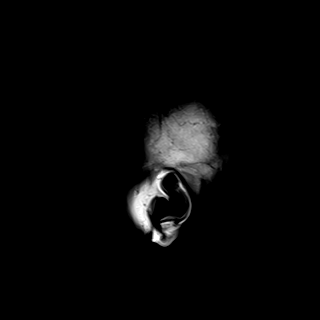

[Series 8: DWI · coronal · 4.0mm · 0.88mm/px · 6 of 70 slices shown (3 of 4)]
[im 1/70]
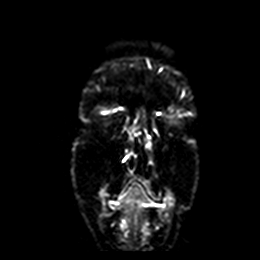
[im 14/70]
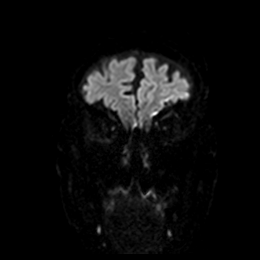
[im 28/70]
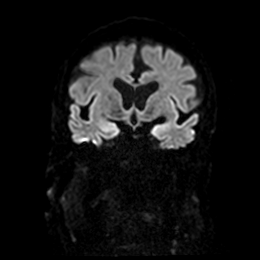
[im 42/70]
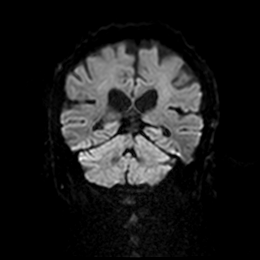
[im 56/70]
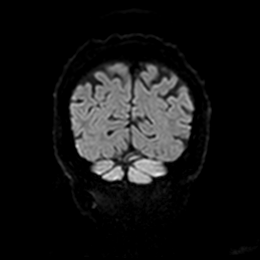
[im 70/70]
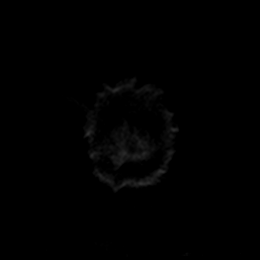

[Series 9: DWI · coronal · 4.0mm · 0.88mm/px · 3 of 35 slices shown (4 of 4)]
[im 1/35]
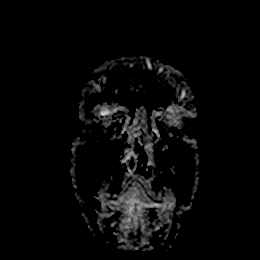
[im 18/35]
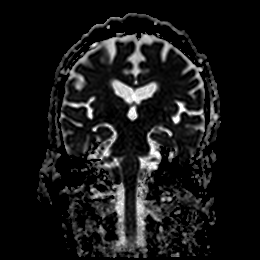
[im 35/35]
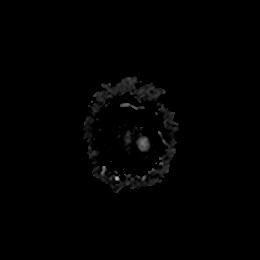

[Series 10: T2 · axial · 5.0mm · 0.72mm/px · z∈[-35,+108]mm · 2 of 25 slices shown (1 of 2)]
[im 1/25]
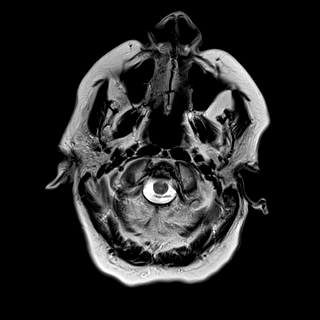
[im 25/25]
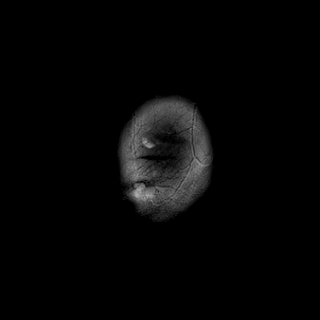

[Series 11: FLAIR · axial · 5.0mm · 0.45mm/px · z∈[-36,+107]mm · 2 of 25 slices shown]
[im 1/25]
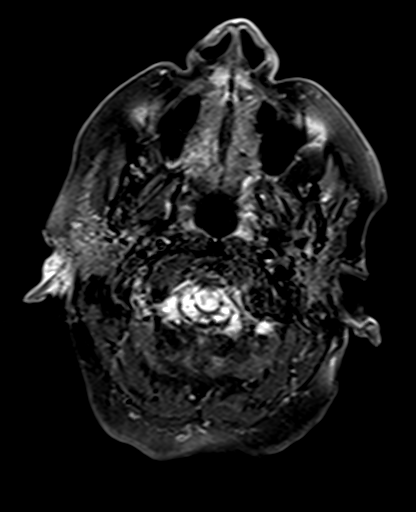
[im 25/25]
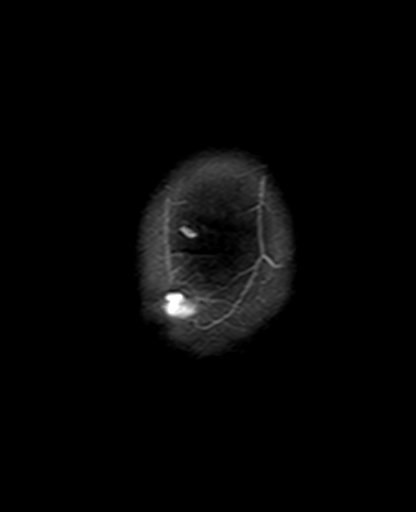

[Series 12: mag_images · axial · 3.0mm · 0.90mm/px · z∈[-34,+118]mm · 4 of 52 slices shown]
[im 1/52]
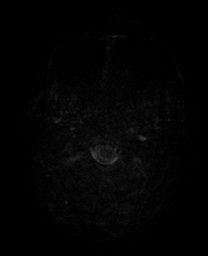
[im 18/52]
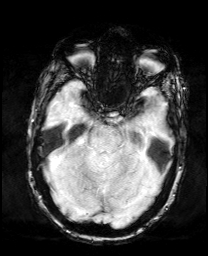
[im 35/52]
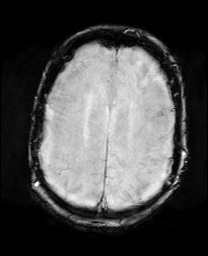
[im 52/52]
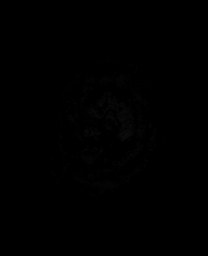

[Series 13: pha_images · axial · 3.0mm · 0.90mm/px · z∈[-34,+118]mm · 4 of 52 slices shown]
[im 1/52]
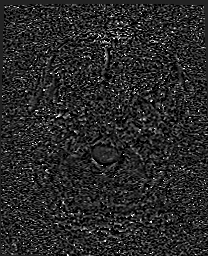
[im 18/52]
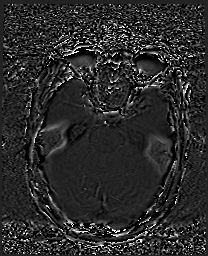
[im 35/52]
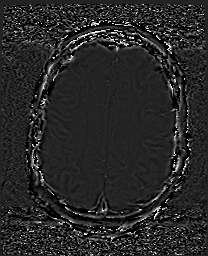
[im 52/52]
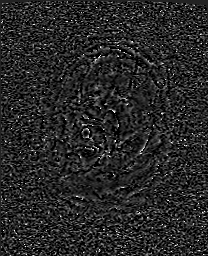

[Series 14: swi_images · axial · 3.0mm · 0.90mm/px · z∈[-34,+118]mm · 4 of 52 slices shown]
[im 1/52]
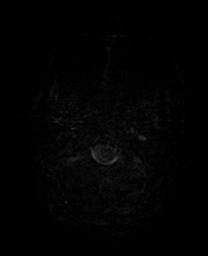
[im 18/52]
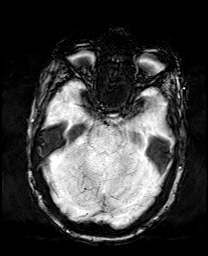
[im 35/52]
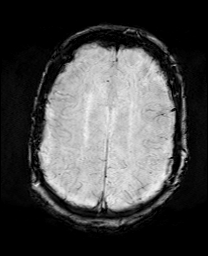
[im 52/52]
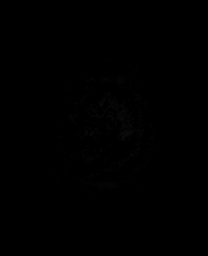

[Series 15: mip_images(sw) · axial · 24.0mm · 0.90mm/px · z∈[-24,+107]mm · 4 of 45 slices shown]
[im 1/45]
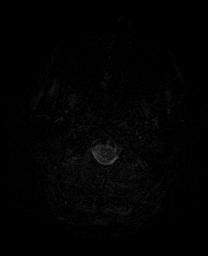
[im 15/45]
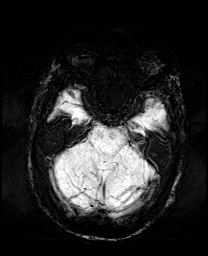
[im 30/45]
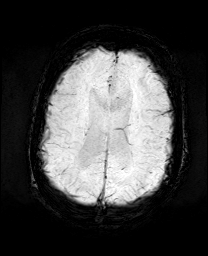
[im 45/45]
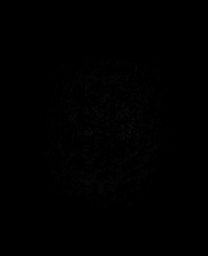

[Series 17: T2 · coronal · 5.0mm · 0.34mm/px · 2 of 31 slices shown (2 of 2)]
[im 1/31]
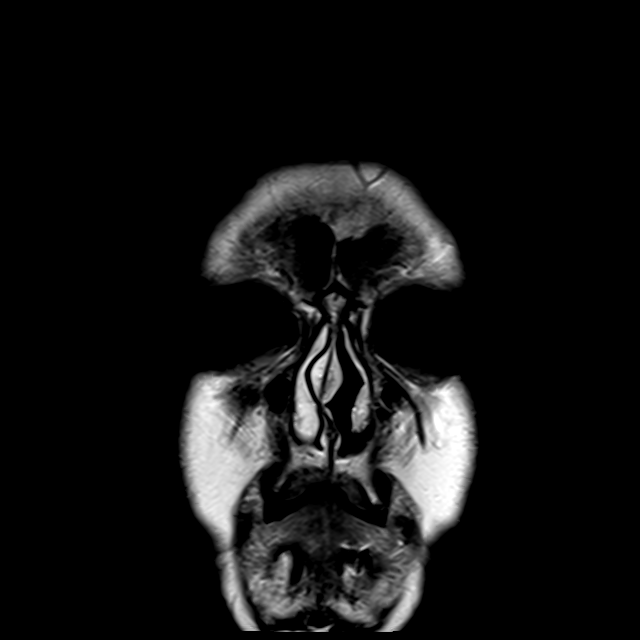
[im 31/31]
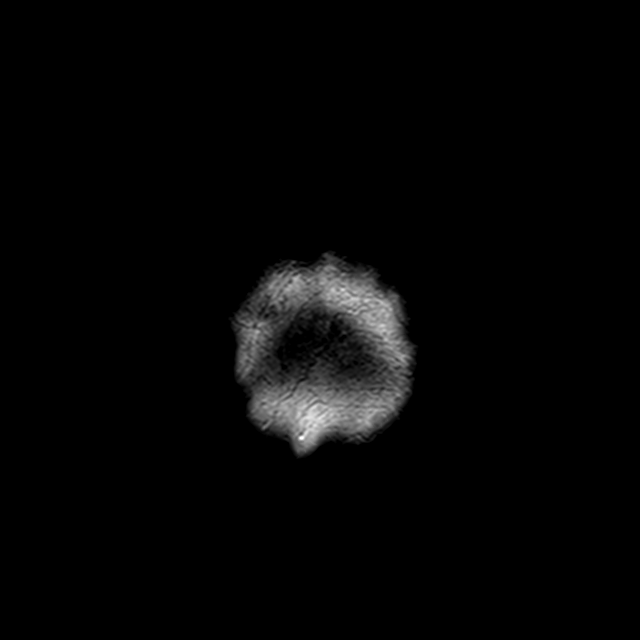

[44 of 48 positions shown; findings below may reference images not displayed]

FINDINGS: Brain: Generalized age-related cerebral atrophy. Mild T2/FLAIR
hyperintensity seen involving the periventricular white matter most
consistent with chronic small vessel ischemic disease, felt to be
within normal limits for age.

No abnormal foci of restricted diffusion to suggest acute or
subacute ischemia. Gray-white matter differentiation maintained. No
encephalomalacia to suggest chronic cortical infarction. No foci of
susceptibility artifact to suggest acute or chronic intracranial
hemorrhage.

No mass lesion, midline shift or mass effect. No hydrocephalus or
extra-axial fluid collection. Pituitary gland suprasellar region
normal. Midline structures intact.

Vascular: Major intracranial vascular flow voids are maintained.

Skull and upper cervical spine: Craniocervical junction within
normal limits. Bone marrow signal intensity normal. No scalp soft
tissue abnormality.

Sinuses/Orbits: Patient status post bilateral ocular lens
replacement. Paranasal sinuses are largely clear. No mastoid
effusion. Inner ear structures grossly normal.

Other: None.
IMPRESSION: Normal brain MRI for age. No acute intracranial abnormality
identified.

## 2021-03-17 IMAGING — DX DG CHEST 2V
2 series · 2 of 2 positions shown · non-contrast
Comparison: Chest radiograph 03/25/2016

CLINICAL DATA: TIA.

EXAM:
CHEST - 2 VIEW

[chest lat]
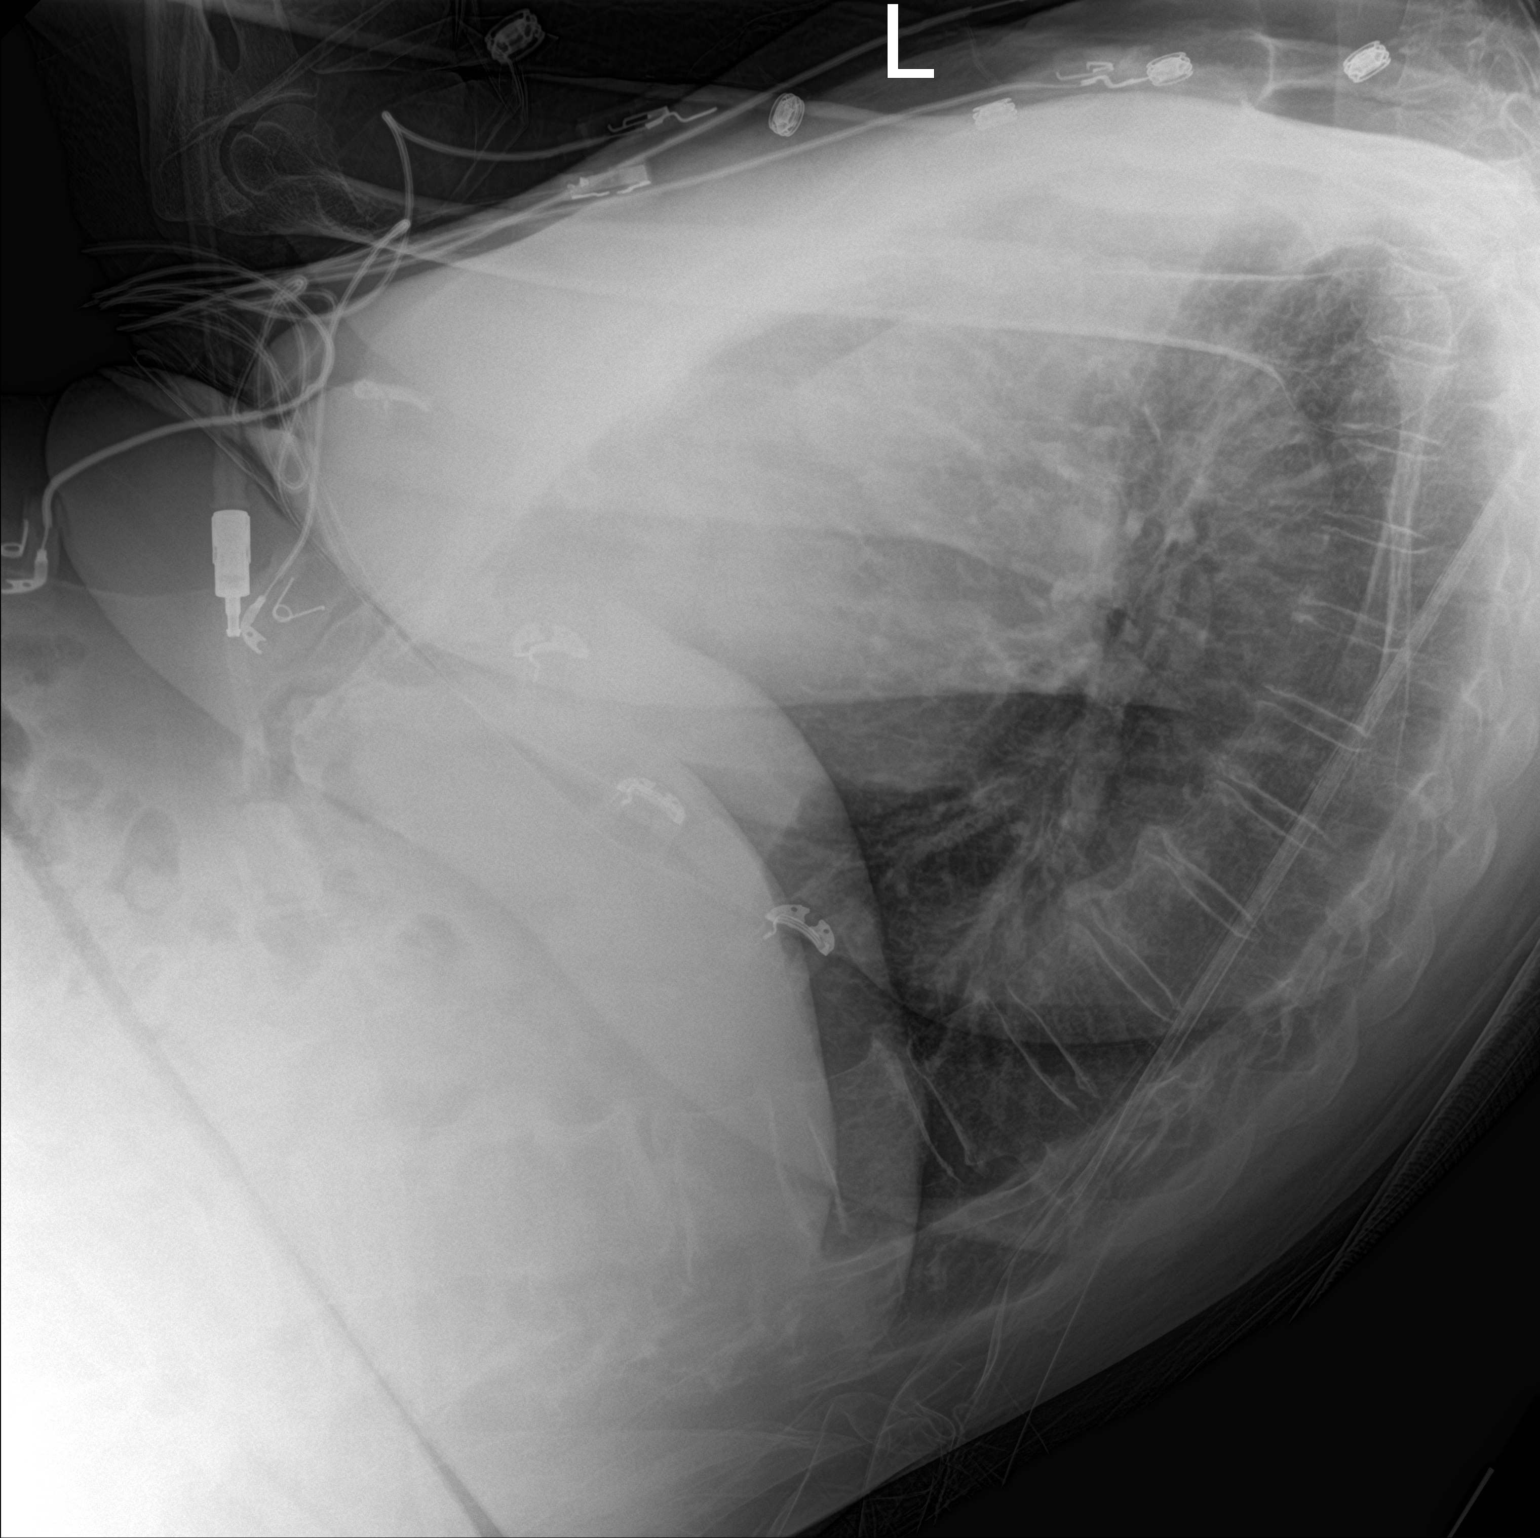

[chest ap]
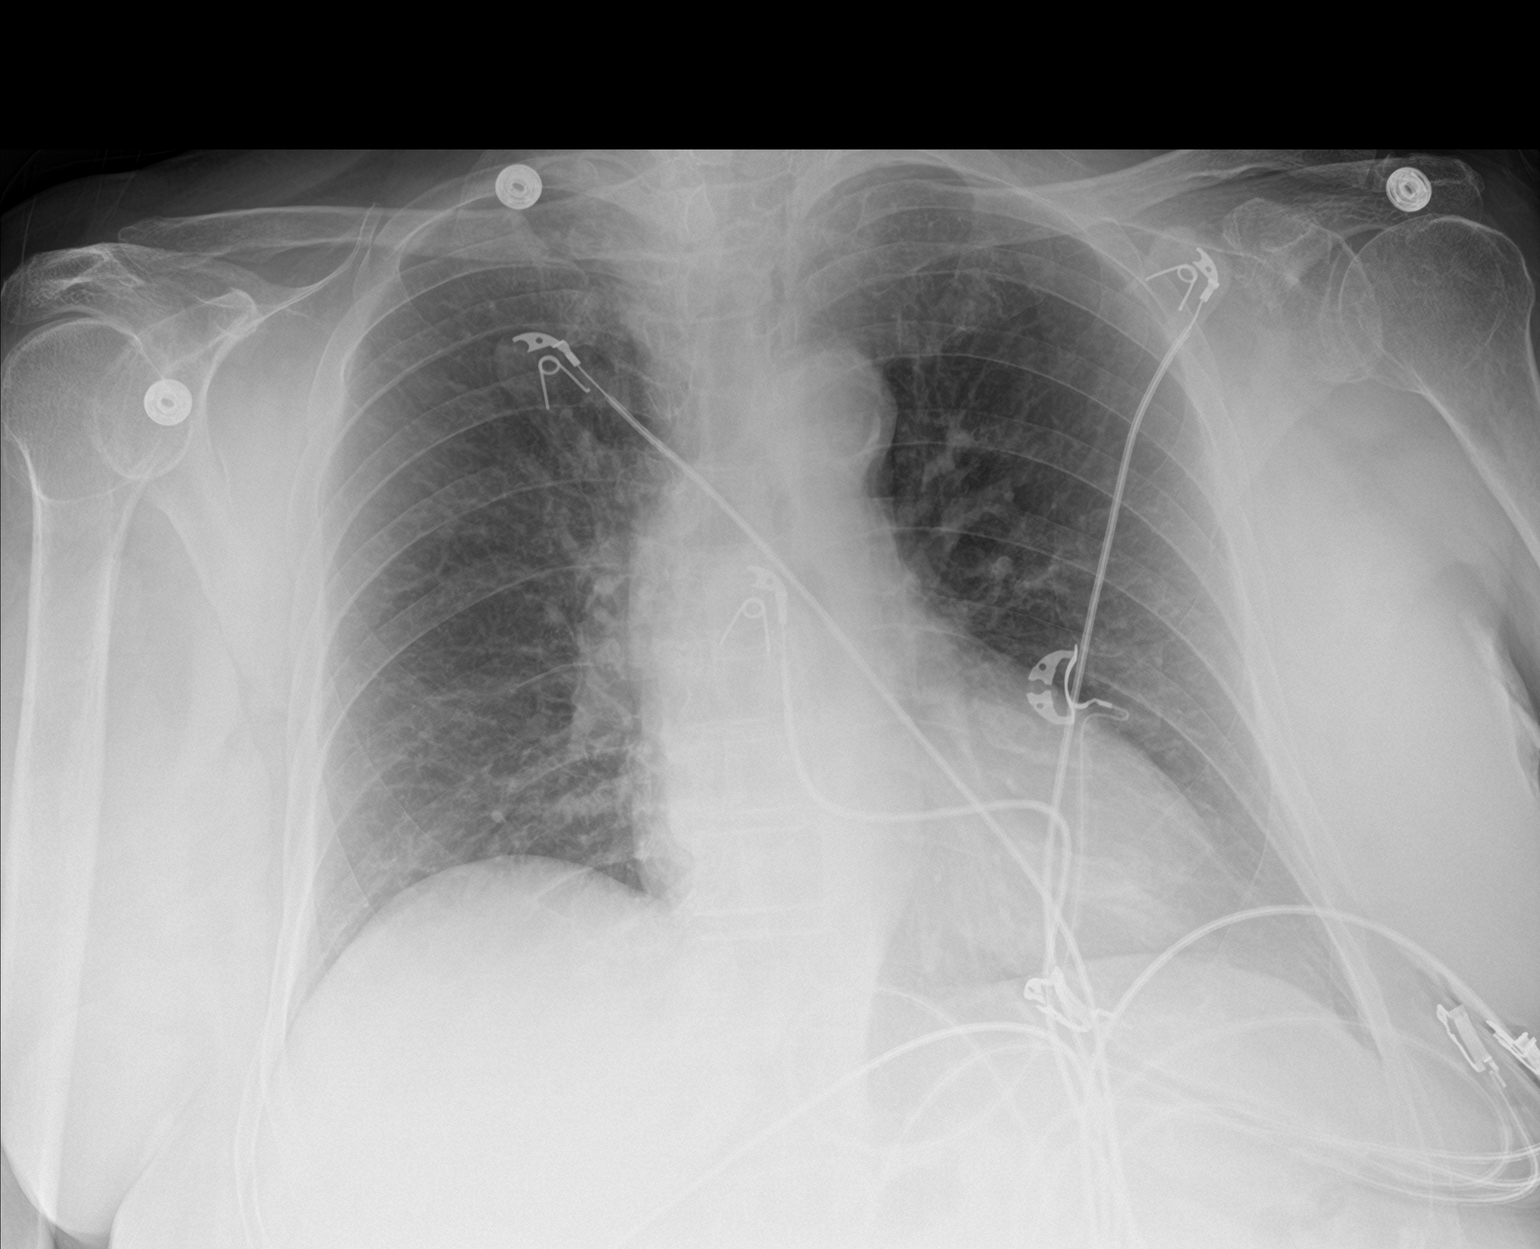

[2 of 2 positions shown; findings below may reference images not displayed]

FINDINGS: Stable cardiomediastinal contours. Aortic arch calcification noted.
The lungs are clear. No pneumothorax or significant pleural
effusion. No acute finding in the visualized skeleton.
IMPRESSION: No acute cardiopulmonary process.

Aortic atherosclerosis.

## 2021-03-30 ENCOUNTER — Ambulatory Visit (INDEPENDENT_AMBULATORY_CARE_PROVIDER_SITE_OTHER): Payer: Medicare Other

## 2021-03-30 ENCOUNTER — Other Ambulatory Visit: Payer: Self-pay

## 2021-03-30 DIAGNOSIS — Z780 Asymptomatic menopausal state: Secondary | ICD-10-CM | POA: Diagnosis not present

## 2021-04-01 DIAGNOSIS — M85832 Other specified disorders of bone density and structure, left forearm: Secondary | ICD-10-CM | POA: Diagnosis not present

## 2021-04-05 ENCOUNTER — Encounter: Payer: Self-pay | Admitting: Family Medicine

## 2021-04-05 DIAGNOSIS — M8580 Other specified disorders of bone density and structure, unspecified site: Secondary | ICD-10-CM

## 2021-04-05 HISTORY — DX: Other specified disorders of bone density and structure, unspecified site: M85.80

## 2021-07-18 DIAGNOSIS — H401221 Low-tension glaucoma, left eye, mild stage: Secondary | ICD-10-CM | POA: Diagnosis not present

## 2021-07-26 ENCOUNTER — Ambulatory Visit (INDEPENDENT_AMBULATORY_CARE_PROVIDER_SITE_OTHER): Payer: Medicare Other | Admitting: Family Medicine

## 2021-07-26 ENCOUNTER — Other Ambulatory Visit: Payer: Self-pay

## 2021-07-26 ENCOUNTER — Encounter: Payer: Self-pay | Admitting: Family Medicine

## 2021-07-26 VITALS — BP 132/84 | HR 84 | Temp 97.3°F | Ht 68.0 in | Wt 181.0 lb

## 2021-07-26 DIAGNOSIS — M533 Sacrococcygeal disorders, not elsewhere classified: Secondary | ICD-10-CM

## 2021-07-26 DIAGNOSIS — Z23 Encounter for immunization: Secondary | ICD-10-CM

## 2021-07-26 DIAGNOSIS — Z8673 Personal history of transient ischemic attack (TIA), and cerebral infarction without residual deficits: Secondary | ICD-10-CM | POA: Diagnosis not present

## 2021-07-26 DIAGNOSIS — E782 Mixed hyperlipidemia: Secondary | ICD-10-CM | POA: Diagnosis not present

## 2021-07-26 DIAGNOSIS — R7303 Prediabetes: Secondary | ICD-10-CM | POA: Diagnosis not present

## 2021-07-26 LAB — LIPID PANEL
Chol/HDL Ratio: 4.9 ratio — ABNORMAL HIGH (ref 0.0–4.4)
Cholesterol, Total: 240 mg/dL — ABNORMAL HIGH (ref 100–199)
HDL: 49 mg/dL (ref >39)
LDL Chol Calc (NIH): 160 mg/dL — ABNORMAL HIGH (ref 0–99)
Triglycerides: 171 mg/dL — ABNORMAL HIGH (ref 0–149)
VLDL Cholesterol Cal: 31 mg/dL (ref 5–40)

## 2021-07-26 LAB — CMP14+EGFR
ALT: 8 IU/L (ref 0–32)
AST: 14 IU/L (ref 0–40)
Albumin/Globulin Ratio: 1.5 (ref 1.2–2.2)
Albumin: 4 g/dL (ref 3.6–4.6)
Alkaline Phosphatase: 74 IU/L (ref 44–121)
BUN/Creatinine Ratio: 15 (ref 12–28)
BUN: 13 mg/dL (ref 8–27)
Bilirubin Total: 0.2 mg/dL (ref 0.0–1.2)
CO2: 25 mmol/L (ref 20–29)
Calcium: 9.2 mg/dL (ref 8.7–10.3)
Chloride: 103 mmol/L (ref 96–106)
Creatinine, Ser: 0.87 mg/dL (ref 0.57–1.00)
Globulin, Total: 2.7 g/dL (ref 1.5–4.5)
Glucose: 108 mg/dL — ABNORMAL HIGH (ref 65–99)
Potassium: 4.5 mmol/L (ref 3.5–5.2)
Sodium: 141 mmol/L (ref 134–144)
Total Protein: 6.7 g/dL (ref 6.0–8.5)
eGFR: 66 mL/min/{1.73_m2} (ref >59)

## 2021-07-26 LAB — BAYER DCA HB A1C WAIVED: HB A1C (BAYER DCA - WAIVED): 5.7 % (ref <7.0)

## 2021-07-26 LAB — CBC WITH DIFFERENTIAL/PLATELET
Basophils Absolute: 0.2 10*3/uL (ref 0.0–0.2)
Basos: 2 %
EOS (ABSOLUTE): 0.3 10*3/uL (ref 0.0–0.4)
Eos: 5 %
Hematocrit: 40 % (ref 34.0–46.6)
Hemoglobin: 13.5 g/dL (ref 11.1–15.9)
Immature Grans (Abs): 0 10*3/uL (ref 0.0–0.1)
Immature Granulocytes: 0 %
Lymphocytes Absolute: 1.9 10*3/uL (ref 0.7–3.1)
Lymphs: 30 %
MCH: 30.6 pg (ref 26.6–33.0)
MCHC: 33.8 g/dL (ref 31.5–35.7)
MCV: 91 fL (ref 79–97)
Monocytes Absolute: 0.6 10*3/uL (ref 0.1–0.9)
Monocytes: 10 %
Neutrophils Absolute: 3.3 10*3/uL (ref 1.4–7.0)
Neutrophils: 53 %
Platelets: 285 10*3/uL (ref 150–450)
RBC: 4.41 x10E6/uL (ref 3.77–5.28)
RDW: 11.9 % (ref 11.7–15.4)
WBC: 6.3 10*3/uL (ref 3.4–10.8)

## 2021-07-26 MED ORDER — PRAVASTATIN SODIUM 40 MG PO TABS: 40.0000 mg | ORAL_TABLET | ORAL | 1 refills | Status: DC

## 2021-07-26 MED ORDER — PREDNISONE 10 MG (21) PO TBPK: ORAL_TABLET | ORAL | 0 refills | Status: DC

## 2021-07-26 NOTE — Progress Notes (Signed)
Assessment & Plan:  1. History of transient ischemic attack (TIA) Patient agreeable to restarting pravastatin once weekly. Continue Plavix. - Lipid panel - pravastatin (PRAVACHOL) 40 MG tablet; Take 1 tablet (40 mg total) by mouth once a week.  Dispense: 12 tablet; Refill: 1  2. Mixed hyperlipidemia Patient agreeable to restarting pravastatin once weekly.  - Lipid panel - CBC with Differential/Platelet - CMP14+EGFR - pravastatin (PRAVACHOL) 40 MG tablet; Take 1 tablet (40 mg total) by mouth once a week.  Dispense: 12 tablet; Refill: 1  3. Prediabetes A1c 5.7 today. - Bayer DCA Hb A1c Waived  4. Pain of right sacroiliac joint Education provided on sacroiliac joint dysfunction. - predniSONE (STERAPRED UNI-PAK 21 TAB) 10 MG (21) TBPK tablet; As directed x 6 days  Dispense: 21 tablet; Refill: 0  5. Immunization due - Pneumococcal polysaccharide vaccine 23-valent greater than or equal to 2yo subcutaneous/IM - given in office.   Return in about 6 months (around 01/26/2022) for follow-up of chronic medication conditions.  Hendricks Limes, MSN, APRN, FNP-C [facility]  Subjective:    Patient ID: Debra Olsen, female    DOB: September 22, 1936, 85 y.o.   MRN: [id number]  Patient Care Team: Loman Brooklyn, FNP as PCP - General (Family Medicine)   Chief Complaint:  Chief Complaint  Patient presents with   Prediabetes   Hyperlipidemia    Check up of chronic medical conditions    Foot Swelling    Patient states she has been having ongoing right foot swelling     HPI: Debra Olsen is a 85 y.o. female presenting on 07/26/2021 for Prediabetes, Hyperlipidemia (Check up of chronic medical conditions ), and Foot Swelling (Patient states she has been having ongoing right foot swelling )  History of TIA/Hyperlipidemia: taking Plavix daily. Has a prescription for pravastatin 40 mg once weekly due to failure of previous statins, but has not been taking it. States she was  tolerating it well.    New complaints: Patient reports right foot swelling with pain from her right buttock down her leg that has been going on for months. She is active and does a lot of yard work. Pain is worse with movement, but does not bother her when she is sitting down. She rates it 5/10. She takes Advil 200 mg once daily.    Social history:  Relevant past medical, surgical, family and social history reviewed and updated as indicated. Interim medical history since our last visit reviewed.  Allergies and medications reviewed and updated.  DATA REVIEWED: CHART IN EPIC  ROS: Negative unless specifically indicated above in HPI.    Current Outpatient Medications:    acetaminophen (TYLENOL) 500 MG tablet, Take 1,000 mg by mouth every 6 (six) hours as needed for mild pain., Disp: , Rfl:    clopidogrel (PLAVIX) 75 MG tablet, Take 1 tablet (75 mg total) by mouth daily., Disp: 90 tablet, Rfl: 3   MAGNESIUM PO, Take 1 tablet by mouth daily., Disp: , Rfl:    Multiple Vitamin (MULTIVITAMIN) tablet, Take 1 tablet by mouth daily., Disp: , Rfl:    vitamin B-12 1000 MCG tablet, Take 1 tablet (1,000 mcg total) by mouth daily., Disp: 30 tablet, Rfl: 0   Allergies  Allergen Reactions   Lidocaine Other (See Comments)    Passed out   Penicillins Rash   Past Medical History:  Diagnosis Date   Arthritis    Diverticulosis of intestine    GERD (gastroesophageal reflux disease)    Mixed  hyperlipidemia    Osteopenia 04/05/2021   Prediabetes    TIA (transient ischemic attack)    Vitamin B12 deficiency     Past Surgical History:  Procedure Laterality Date   ABDOMINAL HYSTERECTOMY      Social History   Socioeconomic History   Marital status: Married    Spouse name: Not on file   Number of children: Not on file   Years of education: Not on file   Highest education level: Not on file  Occupational History   Not on file  Tobacco Use   Smoking status: Former    Types: Cigarettes    Smokeless tobacco: Never  Vaping Use   Vaping Use: Never used  Substance and Sexual Activity   Alcohol use: No   Drug use: No   Sexual activity: Not Currently  Other Topics Concern   Not on file  Social History Narrative   Not on file   Social Determinants of Health   Financial Resource Strain: Not on file  Food Insecurity: Not on file  Transportation Needs: Not on file  Physical Activity: Not on file  Stress: Not on file  Social Connections: Not on file  Intimate Partner Violence: Not on file        Objective:    BP 132/84   Pulse 84   Temp (!) 97.3 F (36.3 C) (Temporal)   Ht $R'5\' 8"'pT$  (1.727 m)   Wt 181 lb (82.1 kg)   SpO2 95%   BMI 27.52 kg/m   Wt Readings from Last 3 Encounters:  07/26/21 181 lb (82.1 kg)  01/25/21 186 lb (84.4 kg)  08/25/20 188 lb 12.8 oz (85.6 kg)    Physical Exam Vitals reviewed.  Constitutional:      General: She is not in acute distress.    Appearance: Normal appearance. She is overweight. She is not ill-appearing, toxic-appearing or diaphoretic.  HENT:     Head: Normocephalic and atraumatic.  Eyes:     General: No scleral icterus.       Right eye: No discharge.        Left eye: No discharge.     Conjunctiva/sclera: Conjunctivae normal.  Cardiovascular:     Rate and Rhythm: Normal rate and regular rhythm.     Heart sounds: Normal heart sounds. No murmur heard.   No friction rub. No gallop.  Pulmonary:     Effort: Pulmonary effort is normal. No respiratory distress.     Breath sounds: Normal breath sounds. No stridor. No wheezing, rhonchi or rales.  Musculoskeletal:        General: Normal range of motion.     Cervical back: Normal range of motion.     Lumbar back: Negative right straight leg raise test.     Right hip: Normal.     Right lower leg: Edema present.     Comments: Pain in right SI joint.  Skin:    General: Skin is warm and dry.     Capillary Refill: Capillary refill takes less than 2 seconds.  Neurological:      General: No focal deficit present.     Mental Status: She is alert and oriented to person, place, and time. Mental status is at baseline.  Psychiatric:        Mood and Affect: Mood normal.        Behavior: Behavior normal.        Thought Content: Thought content normal.        Judgment: Judgment normal.  Lab Results  Component Value Date   TSH 1.910 08/25/2020   Lab Results  Component Value Date   WBC 6.4 01/25/2021   HGB 13.7 01/25/2021   HCT 39.5 01/25/2021   MCV 91 01/25/2021   PLT 314 01/25/2021   Lab Results  Component Value Date   NA 143 01/25/2021   K 4.3 01/25/2021   CO2 25 01/25/2021   GLUCOSE 102 (H) 01/25/2021   BUN 15 01/25/2021   CREATININE 0.82 01/25/2021   BILITOT 0.3 01/25/2021   ALKPHOS 81 01/25/2021   AST 16 01/25/2021   ALT 10 01/25/2021   PROT 6.8 01/25/2021   ALBUMIN 3.8 01/25/2021   CALCIUM 8.8 01/25/2021   ANIONGAP 8 02/29/2020   Lab Results  Component Value Date   CHOL 237 (H) 01/25/2021   Lab Results  Component Value Date   HDL 54 01/25/2021   Lab Results  Component Value Date   LDLCALC 158 (H) 01/25/2021   Lab Results  Component Value Date   TRIG 137 01/25/2021   Lab Results  Component Value Date   CHOLHDL 4.4 01/25/2021   Lab Results  Component Value Date   HGBA1C 5.6 01/25/2021

## 2021-12-05 DIAGNOSIS — H401221 Low-tension glaucoma, left eye, mild stage: Secondary | ICD-10-CM | POA: Diagnosis not present

## 2021-12-27 ENCOUNTER — Other Ambulatory Visit: Payer: Self-pay | Admitting: Family Medicine

## 2021-12-27 DIAGNOSIS — G459 Transient cerebral ischemic attack, unspecified: Secondary | ICD-10-CM

## 2022-01-13 ENCOUNTER — Telehealth: Payer: Self-pay

## 2022-01-13 NOTE — Telephone Encounter (Signed)
Received a call from [facility] about patient having a dental procedure and wanted to know how long to stop Plavix.  Spoke with Selma and advised 2 days prior.  Dentist office aware and verbalizes understanding.

## 2022-01-19 ENCOUNTER — Ambulatory Visit (INDEPENDENT_AMBULATORY_CARE_PROVIDER_SITE_OTHER): Payer: Medicare Other

## 2022-01-19 ENCOUNTER — Telehealth: Payer: Self-pay | Admitting: Family Medicine

## 2022-01-19 VITALS — Ht 68.0 in | Wt 181.0 lb

## 2022-01-19 DIAGNOSIS — Z0000 Encounter for general adult medical examination without abnormal findings: Secondary | ICD-10-CM | POA: Diagnosis not present

## 2022-01-19 NOTE — Telephone Encounter (Signed)
Debra Olsen at [facility] aware of provider feedback.

## 2022-01-19 NOTE — Progress Notes (Signed)
Subjective:   Debra Olsen is a 86 y.o. female who presents for Medicare Annual (Subsequent) preventive examination.  Virtual Visit via Telephone Note  I connected with  Debra Olsen on 01/19/22 at  9:00 AM EST by telephone and verified that I am speaking with the correct person using two identifiers.  Location: Patient: Home Provider: [facility] Persons participating in the virtual visit: patient/Nurse Health Advisor   I discussed the limitations, risks, security and privacy concerns of performing an evaluation and management service by telephone and the availability of in person appointments. The patient expressed understanding and agreed to proceed.  Interactive audio and video telecommunications were attempted between this nurse and patient, however failed, due to patient having technical difficulties OR patient did not have access to video capability.  We continued and completed visit with audio only.  Some vital signs may be absent or patient reported.   Debra Evers E Sailor Hevia, LPN   Review of Systems     Cardiac Risk Factors include: advanced age (>14men, >19 women);dyslipidemia;Other (see comment), Risk factor comments: hx of TIA     Objective:    Today's Vitals   01/19/22 0904  Weight: 181 lb (82.1 kg)  Height: 5\' 8"  (1.727 m)   Body mass index is 27.52 kg/m.  Advanced Directives 01/19/2022 01/18/2021 02/29/2020 03/08/2018 03/25/2016 04/05/2015 10/06/2014  Does Patient Have a Medical Advance Directive? Yes Yes No No No No No  Type of Paramedic of [place];Living will - - - - -  Does patient want to make changes to medical advance directive? - No - Patient declined - - - - -  Copy of [place] in Chart? No - copy requested - - - - - -  Would patient like information on creating a medical advance directive? - - No - Patient declined - No - patient declined information No - patient declined information -     Current Medications (verified) Outpatient Encounter Medications as of 01/19/2022  Medication Sig   acetaminophen (TYLENOL) 500 MG tablet Take 1,000 mg by mouth every 6 (six) hours as needed for mild pain.   clindamycin (CLEOCIN) 150 MG capsule Take by mouth.   clopidogrel (PLAVIX) 75 MG tablet TAKE 1 TABLET DAILY   latanoprost (XALATAN) 0.005 % ophthalmic solution 1 drop at bedtime.   MAGNESIUM PO Take 1 tablet by mouth daily.   Multiple Vitamin (MULTIVITAMIN) tablet Take 1 tablet by mouth daily.   pravastatin (PRAVACHOL) 40 MG tablet Take 1 tablet (40 mg total) by mouth once a week.   vitamin B-12 1000 MCG tablet Take 1 tablet (1,000 mcg total) by mouth daily.   [DISCONTINUED] predniSONE (STERAPRED UNI-PAK 21 TAB) 10 MG (21) TBPK tablet As directed x 6 days   No facility-administered encounter medications on file as of 01/19/2022.    Allergies (verified) Lidocaine and Penicillins   History: Past Medical History:  Diagnosis Date   Arthritis    Diverticulosis of intestine    GERD (gastroesophageal reflux disease)    Mixed hyperlipidemia    Osteopenia 04/05/2021   Prediabetes    TIA (transient ischemic attack)    Vitamin B12 deficiency    Past Surgical History:  Procedure Laterality Date   ABDOMINAL HYSTERECTOMY     Family History  Problem Relation Age of Onset   Heart failure Father    Heart disease Father    Cancer Sister    Cancer Other    Kidney disease Mother  Diabetes Brother    Bone cancer Sister    Breast cancer Sister    Alzheimer's disease Sister    Cancer Brother        unknown   Heart disease Brother    Social History   Socioeconomic History   Marital status: Married    Spouse name: Not on file   Number of children: 5   Years of education: Not on file   Highest education level: Not on file  Occupational History   Occupation: retired  Tobacco Use   Smoking status: Former    Types: Cigarettes   Smokeless tobacco: Never  Brewing technologist Use: Never used  Substance and Sexual Activity   Alcohol use: No   Drug use: No   Sexual activity: Not Currently  Other Topics Concern   Not on file  Social History Narrative   Visits with daughter, Otila Kluver a lot   Other children live out of state   Daughter in [place] is a Tax adviser and their POA   Social Determinants of Health   Financial Resource Strain: Low Risk    Difficulty of Paying Living Expenses: Not very hard  Food Insecurity: No Food Insecurity   Worried About Charity fundraiser in the Last Year: Never true   Arboriculturist in the Last Year: Never true  Transportation Needs: No Transportation Needs   Lack of Transportation (Medical): No   Lack of Transportation (Non-Medical): No  Physical Activity: Insufficiently Active   Days of Exercise per Week: 7 days   Minutes of Exercise per Session: 20 min  Stress: Stress Concern Present   Feeling of Stress : To some extent  Social Connections: Moderately Isolated   Frequency of Communication with Friends and Family: More than three times a week   Frequency of Social Gatherings with Friends and Family: More than three times a week   Attends Religious Services: Never   Marine scientist or Organizations: No   Attends Music therapist: Never   Marital Status: Married    Tobacco Counseling Counseling given: Not Answered   Clinical Intake:  Pre-visit preparation completed: Yes  Pain : No/denies pain     BMI - recorded: 27.52 Nutritional Status: BMI 25 -29 Overweight Nutritional Risks: None Diabetes: No  How often do you need to have someone help you when you read instructions, pamphlets, or other written materials from your doctor or pharmacy?: 1 - Never  Diabetic? no  Interpreter Needed?: No  Information entered by :: Yuji Walth, LPN   Activities of Daily Living In your present state of health, do you have any difficulty performing the following activities: 01/19/2022  Hearing? N   Vision? Y  Comment has glaucoma - plans to get new glasses soon  Difficulty concentrating or making decisions? Y  Comment mild memory issues  Walking or climbing stairs? Y  Comment hurts, but she can do this  Dressing or bathing? N  Doing errands, shopping? N  Preparing Food and eating ? N  Using the Toilet? N  In the past six months, have you accidently leaked urine? N  Do you have problems with loss of bowel control? N  Managing your Medications? N  Managing your Finances? N  Housekeeping or managing your Housekeeping? N  Some recent data might be hidden    Patient Care Team: Loman Brooklyn, FNP as PCP - General (Family Medicine)  Indicate any recent Medical Services you may have  received from other than [facility] providers in the past year (date may be approximate).     Assessment:   This is a routine wellness examination for [place].  Hearing/Vision screen Hearing Screening - Comments:: Denies hearing difficulties  Vision Screening - Comments:: Wears rx glasses - has glaucoma - up to date with annual eye exams with [facility] in [place] issues and exercise activities discussed: Current Exercise Habits: Home exercise routine, Type of exercise: walking, Time (Minutes): 20, Frequency (Times/Week): 7, Weekly Exercise (Minutes/Week): 140, Intensity: Mild, Exercise limited by: orthopedic condition(s)   Goals Addressed             This Visit's Progress    DIET - EAT MORE FRUITS AND VEGETABLES         Depression Screen PHQ 2/9 Scores 01/19/2022 07/26/2021 01/25/2021 01/18/2021 08/25/2020  PHQ - 2 Score 2 0 0 0 0  PHQ- 9 Score 3 2 - - -    Fall Risk Fall Risk  01/19/2022 07/26/2021 01/25/2021 01/18/2021 08/25/2020  Falls in the past year? 0 0 0 0 0  Number falls in past yr: 0 - - - -  Injury with Fall? 0 - - - -  Risk for fall due to : Other (Comment) - - - -  Risk for fall due to: Comment hx of TIA - - - -  Follow up Falls prevention discussed - - - -    FALL RISK PREVENTION  PERTAINING TO THE HOME:  Any stairs in or around the home? No  If so, are there any without handrails? No  Home free of loose throw rugs in walkways, pet beds, electrical cords, etc? Yes  Adequate lighting in your home to reduce risk of falls? Yes   ASSISTIVE DEVICES UTILIZED TO PREVENT FALLS:  Life alert? No  Use of a cane, walker or w/c? No  Grab bars in the bathroom? No  Shower chair or bench in shower? Yes  Elevated toilet seat or a handicapped toilet? Yes   TIMED UP AND GO:  Was the test performed? No . Telephonic visit  Cognitive Function: Normal cognitive status assessed by direct observation by this Nurse Health Advisor. No abnormalities found.       6CIT Screen 01/19/2022 01/18/2021  What Year? 0 points 0 points  What month? 0 points 0 points  What time? 0 points 3 points  Count back from 20 0 points 0 points  Months in reverse 0 points 0 points  Repeat phrase 4 points 10 points  Total Score 4 13    Immunizations Immunization History  Administered Date(s) Administered   Pneumococcal Conjugate-13 10/05/2015   Pneumococcal Polysaccharide-23 07/26/2021    TDAP status: Due, Education has been provided regarding the importance of this vaccine. Advised may receive this vaccine at local pharmacy or Health Dept. Aware to provide a copy of the vaccination record if obtained from local pharmacy or Health Dept. Verbalized acceptance and understanding.  Flu Vaccine status: Declined, Education has been provided regarding the importance of this vaccine but patient still declined. Advised may receive this vaccine at local pharmacy or Health Dept. Aware to provide a copy of the vaccination record if obtained from local pharmacy or Health Dept. Verbalized acceptance and understanding.  Pneumococcal vaccine status: Up to date  Covid-19 vaccine status: Declined, Education has been provided regarding the importance of this vaccine but patient still declined. Advised may receive this  vaccine at local pharmacy or Health Dept.or vaccine clinic. Aware to provide a  copy of the vaccination record if obtained from local pharmacy or Health Dept. Verbalized acceptance and understanding.  Qualifies for Shingles Vaccine? Yes   Zostavax completed No   Shingrix Completed?: No.    Education has been provided regarding the importance of this vaccine. Patient has been advised to call insurance company to determine out of pocket expense if they have not yet received this vaccine. Advised may also receive vaccine at local pharmacy or Health Dept. Verbalized acceptance and understanding.  Screening Tests Health Maintenance  Topic Date Due   URINE MICROALBUMIN  01/25/2022   INFLUENZA VACCINE  03/24/2022 (Originally 07/25/2021)   DEXA SCAN  04/02/2023   Pneumonia Vaccine 65+ Years old  Completed   HPV VACCINES  Aged Out   TETANUS/TDAP  Discontinued   COVID-19 Vaccine  Discontinued   Zoster Vaccines- Shingrix  Discontinued    Health Maintenance  Health Maintenance Due  Topic Date Due   URINE MICROALBUMIN  01/25/2022    Colorectal cancer screening: No longer required.   Mammogram status: No longer required due to age and declined.  Bone Density status: Completed 04/01/2021. Results reflect: Bone density results: OSTEOPENIA. Repeat every 2 years.  Lung Cancer Screening: (Low Dose CT Chest recommended if Age 54-80 years, 30 pack-year currently smoking OR have quit w/in 15years.) does not qualify.   Additional Screening:  Hepatitis C Screening: does not qualify  Vision Screening: Recommended annual ophthalmology exams for early detection of glaucoma and other disorders of the eye. Is the patient up to date with their annual eye exam?  Yes  Who is the provider or what is the name of the office in which the patient attends annual eye exams? [facility] [place] If pt is not established with a provider, would they like to be referred to a provider to establish care? No .   Dental Screening:  Recommended annual dental exams for proper oral hygiene  Community Resource Referral / Chronic Care Management: CRR required this visit?  No   CCM required this visit?  No      Plan:     I have personally reviewed and noted the following in the patients chart:   Medical and social history Use of alcohol, tobacco or illicit drugs  Current medications and supplements including opioid prescriptions.  Functional ability and status Nutritional status Physical activity Advanced directives List of other physicians Hospitalizations, surgeries, and ER visits in previous 12 months Vitals Screenings to include cognitive, depression, and falls Referrals and appointments  In addition, I have reviewed and discussed with patient certain preventive protocols, quality metrics, and best practice recommendations. A written personalized care plan for preventive services as well as general preventive health recommendations were provided to patient.     Sandrea Hammond, LPN   07/13/9469   Nurse Notes: patient has to have a dental surgery next week - is taking Clindamycin - she was advised to stop Plavix, but didn't know how long, so she hasn't taken any all this week - please call patient with advice.

## 2022-01-19 NOTE — Telephone Encounter (Signed)
On 01/13/2022 we advised that she needed to stop Plavix 2 days prior to her procedure.

## 2022-01-19 NOTE — Telephone Encounter (Signed)
Spoke with patient today and she stopped taking Plavix Monday - wants to know when to start back - she is taking Clindamycin pre-procedure - the surgery is next Tuesday. Thanks

## 2022-01-19 NOTE — Patient Instructions (Signed)
Debra Olsen , Thank you for taking time to come for your Medicare Wellness Visit. I appreciate your ongoing commitment to your health goals. Please review the following plan we discussed and let me know if I can assist you in the future.   Screening recommendations/referrals: Colonoscopy: No longer required Mammogram: No longer required Bone Density: Done 04/01/2021 - Repeat every 2 years  Recommended yearly ophthalmology/optometry visit for glaucoma screening and checkup Recommended yearly dental visit for hygiene and checkup  Vaccinations: Influenza vaccine: Declined Pneumococcal vaccine: Done 09/25/2015 & 07/26/2021 Tdap vaccine: Due Shingles vaccine: Due   Covid-19: Declined  Advanced directives: Please bring a copy of your health care power of attorney and living will to the office to be added to your chart at your convenience.   Conditions/risks identified: Aim for 30 minutes of exercise or brisk walking each day, drink 6-8 glasses of water and eat lots of fruits and vegetables.   Next appointment: Follow up in one year for your annual wellness visit    Preventive Care 65 Years and Older, Female Preventive care refers to lifestyle choices and visits with your health care provider that can promote health and wellness. What does preventive care include? A yearly physical exam. This is also called an annual well check. Dental exams once or twice a year. Routine eye exams. Ask your health care provider how often you should have your eyes checked. Personal lifestyle choices, including: Daily care of your teeth and gums. Regular physical activity. Eating a healthy diet. Avoiding tobacco and drug use. Limiting alcohol use. Practicing safe sex. Taking low-dose aspirin every day. Taking vitamin and mineral supplements as recommended by your health care provider. What happens during an annual well check? The services and screenings done by your health care provider during your annual  well check will depend on your age, overall health, lifestyle risk factors, and family history of disease. Counseling  Your health care provider may ask you questions about your: Alcohol use. Tobacco use. Drug use. Emotional well-being. Home and relationship well-being. Sexual activity. Eating habits. History of falls. Memory and ability to understand (cognition). Work and work Statistician. Reproductive health. Screening  You may have the following tests or measurements: Height, weight, and BMI. Blood pressure. Lipid and cholesterol levels. These may be checked every 5 years, or more frequently if you are over 30 years old. Skin check. Lung cancer screening. You may have this screening every year starting at age 76 if you have a 30-pack-year history of smoking and currently smoke or have quit within the past 15 years. Fecal occult blood test (FOBT) of the stool. You may have this test every year starting at age 4. Flexible sigmoidoscopy or colonoscopy. You may have a sigmoidoscopy every 5 years or a colonoscopy every 10 years starting at age 42. Hepatitis C blood test. Hepatitis B blood test. Sexually transmitted disease (STD) testing. Diabetes screening. This is done by checking your blood sugar (glucose) after you have not eaten for a while (fasting). You may have this done every 1-3 years. Bone density scan. This is done to screen for osteoporosis. You may have this done starting at age 64. Mammogram. This may be done every 1-2 years. Talk to your health care provider about how often you should have regular mammograms. Talk with your health care provider about your test results, treatment options, and if necessary, the need for more tests. Vaccines  Your health care provider may recommend certain vaccines, such as: Influenza vaccine. This is  recommended every year. Tetanus, diphtheria, and acellular pertussis (Tdap, Td) vaccine. You may need a Td booster every 10 years. Zoster  vaccine. You may need this after age 23. Pneumococcal 13-valent conjugate (PCV13) vaccine. One dose is recommended after age 23. Pneumococcal polysaccharide (PPSV23) vaccine. One dose is recommended after age 26. Talk to your health care provider about which screenings and vaccines you need and how often you need them. This information is not intended to replace advice given to you by your health care provider. Make sure you discuss any questions you have with your health care provider. Document Released: 01/07/2016 Document Revised: 08/30/2016 Document Reviewed: 10/12/2015 Elsevier Interactive Patient Education  2017 [place] Prevention in the Home Falls can cause injuries. They can happen to people of all ages. There are many things you can do to make your home safe and to help prevent falls. What can I do on the outside of my home? Regularly fix the edges of walkways and driveways and fix any cracks. Remove anything that might make you trip as you walk through a door, such as a raised step or threshold. Trim any bushes or trees on the path to your home. Use bright outdoor lighting. Clear any walking paths of anything that might make someone trip, such as rocks or tools. Regularly check to see if handrails are loose or broken. Make sure that both sides of any steps have handrails. Any raised decks and porches should have guardrails on the edges. Have any leaves, snow, or ice cleared regularly. Use sand or salt on walking paths during winter. Clean up any spills in your garage right away. This includes oil or grease spills. What can I do in the bathroom? Use night lights. Install grab bars by the toilet and in the tub and shower. Do not use towel bars as grab bars. Use non-skid mats or decals in the tub or shower. If you need to sit down in the shower, use a plastic, non-slip stool. Keep the floor dry. Clean up any water that spills on the floor as soon as it happens. Remove  soap buildup in the tub or shower regularly. Attach bath mats securely with double-sided non-slip rug tape. Do not have throw rugs and other things on the floor that can make you trip. What can I do in the bedroom? Use night lights. Make sure that you have a light by your bed that is easy to reach. Do not use any sheets or blankets that are too big for your bed. They should not hang down onto the floor. Have a firm chair that has side arms. You can use this for support while you get dressed. Do not have throw rugs and other things on the floor that can make you trip. What can I do in the kitchen? Clean up any spills right away. Avoid walking on wet floors. Keep items that you use a lot in easy-to-reach places. If you need to reach something above you, use a strong step stool that has a grab bar. Keep electrical cords out of the way. Do not use floor polish or wax that makes floors slippery. If you must use wax, use non-skid floor wax. Do not have throw rugs and other things on the floor that can make you trip. What can I do with my stairs? Do not leave any items on the stairs. Make sure that there are handrails on both sides of the stairs and use them. Fix handrails that are  broken or loose. Make sure that handrails are as long as the stairways. Check any carpeting to make sure that it is firmly attached to the stairs. Fix any carpet that is loose or worn. Avoid having throw rugs at the top or bottom of the stairs. If you do have throw rugs, attach them to the floor with carpet tape. Make sure that you have a light switch at the top of the stairs and the bottom of the stairs. If you do not have them, ask someone to add them for you. What else can I do to help prevent falls? Wear shoes that: Do not have high heels. Have rubber bottoms. Are comfortable and fit you well. Are closed at the toe. Do not wear sandals. If you use a stepladder: Make sure that it is fully opened. Do not climb a  closed stepladder. Make sure that both sides of the stepladder are locked into place. Ask someone to hold it for you, if possible. Clearly mark and make sure that you can see: Any grab bars or handrails. First and last steps. Where the edge of each step is. Use tools that help you move around (mobility aids) if they are needed. These include: Canes. Walkers. Scooters. Crutches. Turn on the lights when you go into a dark area. Replace any light bulbs as soon as they burn out. Set up your furniture so you have a clear path. Avoid moving your furniture around. If any of your floors are uneven, fix them. If there are any pets around you, be aware of where they are. Review your medicines with your doctor. Some medicines can make you feel dizzy. This can increase your chance of falling. Ask your doctor what other things that you can do to help prevent falls. This information is not intended to replace advice given to you by your health care provider. Make sure you discuss any questions you have with your health care provider. Document Released: 10/07/2009 Document Revised: 05/18/2016 Document Reviewed: 01/15/2015 Elsevier Interactive Patient Education  2017 [employer].

## 2022-01-26 ENCOUNTER — Encounter: Payer: Self-pay | Admitting: Family Medicine

## 2022-01-26 ENCOUNTER — Ambulatory Visit (INDEPENDENT_AMBULATORY_CARE_PROVIDER_SITE_OTHER): Payer: Medicare Other | Admitting: Family Medicine

## 2022-01-26 VITALS — BP 134/76 | HR 60 | Temp 98.2°F | Ht 68.0 in | Wt 176.2 lb

## 2022-01-26 DIAGNOSIS — R7303 Prediabetes: Secondary | ICD-10-CM

## 2022-01-26 DIAGNOSIS — Z636 Dependent relative needing care at home: Secondary | ICD-10-CM

## 2022-01-26 DIAGNOSIS — Z8673 Personal history of transient ischemic attack (TIA), and cerebral infarction without residual deficits: Secondary | ICD-10-CM | POA: Diagnosis not present

## 2022-01-26 DIAGNOSIS — E782 Mixed hyperlipidemia: Secondary | ICD-10-CM | POA: Diagnosis not present

## 2022-01-26 DIAGNOSIS — M25561 Pain in right knee: Secondary | ICD-10-CM | POA: Diagnosis not present

## 2022-01-26 DIAGNOSIS — K644 Residual hemorrhoidal skin tags: Secondary | ICD-10-CM

## 2022-01-26 DIAGNOSIS — G8929 Other chronic pain: Secondary | ICD-10-CM | POA: Diagnosis not present

## 2022-01-26 LAB — CBC WITH DIFFERENTIAL/PLATELET
Basophils Absolute: 0.2 10*3/uL (ref 0.0–0.2)
Basos: 3 %
EOS (ABSOLUTE): 0.2 10*3/uL (ref 0.0–0.4)
Eos: 3 %
Hematocrit: 42.5 % (ref 34.0–46.6)
Hemoglobin: 14 g/dL (ref 11.1–15.9)
Immature Grans (Abs): 0 10*3/uL (ref 0.0–0.1)
Immature Granulocytes: 0 %
Lymphocytes Absolute: 1.5 10*3/uL (ref 0.7–3.1)
Lymphs: 27 %
MCH: 30.5 pg (ref 26.6–33.0)
MCHC: 32.9 g/dL (ref 31.5–35.7)
MCV: 93 fL (ref 79–97)
Monocytes Absolute: 0.5 10*3/uL (ref 0.1–0.9)
Monocytes: 10 %
Neutrophils Absolute: 3.2 10*3/uL (ref 1.4–7.0)
Neutrophils: 57 %
Platelets: 299 10*3/uL (ref 150–450)
RBC: 4.59 x10E6/uL (ref 3.77–5.28)
RDW: 12 % (ref 11.7–15.4)
WBC: 5.7 10*3/uL (ref 3.4–10.8)

## 2022-01-26 LAB — CMP14+EGFR
ALT: 9 IU/L (ref 0–32)
AST: 15 IU/L (ref 0–40)
Albumin/Globulin Ratio: 1.4 (ref 1.2–2.2)
Albumin: 4.2 g/dL (ref 3.6–4.6)
Alkaline Phosphatase: 78 IU/L (ref 44–121)
BUN/Creatinine Ratio: 13 (ref 12–28)
BUN: 10 mg/dL (ref 8–27)
Bilirubin Total: 0.2 mg/dL (ref 0.0–1.2)
CO2: 24 mmol/L (ref 20–29)
Calcium: 9.2 mg/dL (ref 8.7–10.3)
Chloride: 104 mmol/L (ref 96–106)
Creatinine, Ser: 0.79 mg/dL (ref 0.57–1.00)
Globulin, Total: 3 g/dL (ref 1.5–4.5)
Glucose: 102 mg/dL — ABNORMAL HIGH (ref 70–99)
Potassium: 4.4 mmol/L (ref 3.5–5.2)
Sodium: 141 mmol/L (ref 134–144)
Total Protein: 7.2 g/dL (ref 6.0–8.5)
eGFR: 74 mL/min/{1.73_m2} (ref >59)

## 2022-01-26 LAB — BAYER DCA HB A1C WAIVED: HB A1C (BAYER DCA - WAIVED): 5.7 % — ABNORMAL HIGH (ref 4.8–5.6)

## 2022-01-26 LAB — LIPID PANEL
Chol/HDL Ratio: 4.8 ratio — ABNORMAL HIGH (ref 0.0–4.4)
Cholesterol, Total: 252 mg/dL — ABNORMAL HIGH (ref 100–199)
HDL: 53 mg/dL (ref >39)
LDL Chol Calc (NIH): 170 mg/dL — ABNORMAL HIGH (ref 0–99)
Triglycerides: 158 mg/dL — ABNORMAL HIGH (ref 0–149)
VLDL Cholesterol Cal: 29 mg/dL (ref 5–40)

## 2022-01-26 MED ORDER — BUSPIRONE HCL 7.5 MG PO TABS: 7.5000 mg | ORAL_TABLET | Freq: Two times a day (BID) | ORAL | 2 refills | Status: DC | PRN

## 2022-01-26 NOTE — Patient Instructions (Signed)
Tuck's pads for hemorrhoids.

## 2022-01-26 NOTE — Progress Notes (Signed)
Assessment & Plan:  1. Mixed hyperlipidemia Uncontrolled.  Discussed the importance of improving her cholesterol levels.  She is agreeable to restart the pravastatin once weekly. - Lipid panel - CBC with Differential/Platelet - CMP14+EGFR  2. History of transient ischemic attack (TIA) Agreeable to restart pravastatin once weekly.  Continue Plavix. - Lipid panel  3. Prediabetes - Bayer DCA Hb A1c Waived  4. Caregiver stress Patient does not want any medication to take every day.  She will take BuSpar as needed. - busPIRone (BUSPAR) 7.5 MG tablet; Take 1 tablet (7.5 mg total) by mouth 2 (two) times daily as needed.  Dispense: 60 tablet; Refill: 2  5. Chronic pain of right knee Patient declined an x-ray of her knee since the essential oils she uses are helpful.  6. Hemorrhoids, external Encouraged to use Tucks pads with her Preparation H.  Education provided on hemorrhoids.   Return in about 6 months (around 07/26/2022) for follow-up of chronic medication conditions.  Hendricks Limes, MSN, APRN, FNP-C [facility]  Subjective:    Patient ID: Debra Olsen, female    DOB: 06-26-1936, 86 y.o.   MRN: [id number]  Patient Care Team: Loman Brooklyn, FNP as PCP - General (Family Medicine)   Chief Complaint:  Chief Complaint  Patient presents with   Medical Management of Chronic Issues   Anxiety    X 2 months    Hip Pain    Patient states she has been having right sided leg pain that has been on and off for a while.  Only happens when she walks too much.    Hemorrhoids    HPI: Debra Olsen is a 86 y.o. female presenting on 01/26/2022 for Medical Management of Chronic Issues, Anxiety (X 2 months/), Hip Pain (Patient states she has been having right sided leg pain that has been on and off for a while.  Only happens when she walks too much. ), and Hemorrhoids  History of TIA/Hyperlipidemia: taking Plavix daily. Has a prescription for pravastatin 40 mg once  weekly due to failure of previous statins, which she was suppose to restart six months ago.  She reports today she did not restart the pravastatin simply because she did not want to.  She has been taking garlic cholesterol for a long time (prior to her last lab work).   Patient is scheduled to have two teeth pulled on 02/09/2022.   New complaints: Patient reports right knee pain. She is active and does a lot of yard work. Pain is worse with movement, but does not bother her when she is sitting down. She is rubbing essential oil on the knee which is helpful.   Patient reports she feels like a nervous wreck and this has gotten worse over the past two months. She is the primary caregiver of her husband and he is declining. She states she has nobody to talk to unless she goes to her daughter's house and she feels like she is probably getting tired of hearing it.   GAD 7 : Generalized Anxiety Score 01/26/2022 07/26/2021  Nervous, Anxious, on Edge 1 1  Control/stop worrying 1 1  Worry too much - different things 0 1  Trouble relaxing 1 1  Restless 0 0  Easily annoyed or irritable 0 1  Afraid - awful might happen 0 1  Total GAD 7 Score 3 6  Anxiety Difficulty Not difficult at all Somewhat difficult   She also has hemorrhoids and needs to know what to  put on them. She is using hemorrhoid cream which helps it feel better, but does not decrease the size. They do sometimes bleed. Denies constipation.    Social history:  Relevant past medical, surgical, family and social history reviewed and updated as indicated. Interim medical history since our last visit reviewed.  Allergies and medications reviewed and updated.  DATA REVIEWED: CHART IN EPIC  ROS: Negative unless specifically indicated above in HPI.    Current Outpatient Medications:    acetaminophen (TYLENOL) 500 MG tablet, Take 1,000 mg by mouth every 6 (six) hours as needed for mild pain., Disp: , Rfl:    clopidogrel (PLAVIX) 75 MG tablet,  TAKE 1 TABLET DAILY, Disp: 90 tablet, Rfl: 3   latanoprost (XALATAN) 0.005 % ophthalmic solution, 1 drop at bedtime., Disp: , Rfl:    MAGNESIUM PO, Take 1 tablet by mouth daily., Disp: , Rfl:    Multiple Vitamin (MULTIVITAMIN) tablet, Take 1 tablet by mouth daily., Disp: , Rfl:    vitamin B-12 1000 MCG tablet, Take 1 tablet (1,000 mcg total) by mouth daily., Disp: 30 tablet, Rfl: 0   pravastatin (PRAVACHOL) 40 MG tablet, Take 1 tablet (40 mg total) by mouth once a week. (Patient not taking: Reported on 01/26/2022), Disp: 12 tablet, Rfl: 1   Allergies  Allergen Reactions   Lidocaine Other (See Comments)    Passed out   Penicillins Rash   Past Medical History:  Diagnosis Date   Arthritis    Diverticulosis of intestine    GERD (gastroesophageal reflux disease)    Mixed hyperlipidemia    Osteopenia 04/05/2021   Prediabetes    TIA (transient ischemic attack)    Vitamin B12 deficiency     Past Surgical History:  Procedure Laterality Date   ABDOMINAL HYSTERECTOMY      Social History   Socioeconomic History   Marital status: Married    Spouse name: Not on file   Number of children: 5   Years of education: Not on file   Highest education level: Not on file  Occupational History   Occupation: retired  Tobacco Use   Smoking status: Former    Types: Cigarettes   Smokeless tobacco: Never  Vaping Use   Vaping Use: Never used  Substance and Sexual Activity   Alcohol use: No   Drug use: No   Sexual activity: Not Currently  Other Topics Concern   Not on file  Social History Narrative   Visits with daughter, Otila Kluver a lot   Other children live out of state   Daughter in [place] is a Tax adviser and their POA   Social Determinants of Health   Financial Resource Strain: Low Risk    Difficulty of Paying Living Expenses: Not very hard  Food Insecurity: No Food Insecurity   Worried About Charity fundraiser in the Last Year: Never true   Arboriculturist in the Last Year: Never true   Transportation Needs: No Transportation Needs   Lack of Transportation (Medical): No   Lack of Transportation (Non-Medical): No  Physical Activity: Insufficiently Active   Days of Exercise per Week: 7 days   Minutes of Exercise per Session: 20 min  Stress: Stress Concern Present   Feeling of Stress : To some extent  Social Connections: Moderately Isolated   Frequency of Communication with Friends and Family: More than three times a week   Frequency of Social Gatherings with Friends and Family: More than three times a week   Attends Religious  Services: Never   Database administrator or Organizations: No   Attends Engineer, structural: Never   Marital Status: Married  Catering manager Violence: Not At Risk   Fear of Current or Ex-Partner: No   Emotionally Abused: No   Physically Abused: No   Sexually Abused: No        Objective:    BP 134/76    Pulse 60    Temp 98.2 F (36.8 C) (Temporal)    Ht 5\' 8"  (1.727 m)    Wt 176 lb 3.2 oz (79.9 kg)    SpO2 98%    BMI 26.79 kg/m   Wt Readings from Last 3 Encounters:  01/26/22 176 lb 3.2 oz (79.9 kg)  01/19/22 181 lb (82.1 kg)  07/26/21 181 lb (82.1 kg)    Physical Exam Vitals reviewed.  Constitutional:      General: She is not in acute distress.    Appearance: Normal appearance. She is overweight. She is not ill-appearing, toxic-appearing or diaphoretic.  HENT:     Head: Normocephalic and atraumatic.  Eyes:     General: No scleral icterus.       Right eye: No discharge.        Left eye: No discharge.     Conjunctiva/sclera: Conjunctivae normal.  Cardiovascular:     Rate and Rhythm: Normal rate and regular rhythm.     Heart sounds: Normal heart sounds. No murmur heard.   No friction rub. No gallop.  Pulmonary:     Effort: Pulmonary effort is normal. No respiratory distress.     Breath sounds: Normal breath sounds. No stridor. No wheezing, rhonchi or rales.  Musculoskeletal:        General: Normal range of motion.      Cervical back: Normal range of motion.  Skin:    General: Skin is warm and dry.     Capillary Refill: Capillary refill takes less than 2 seconds.  Neurological:     General: No focal deficit present.     Mental Status: She is alert and oriented to person, place, and time. Mental status is at baseline.  Psychiatric:        Mood and Affect: Mood normal.        Behavior: Behavior normal.        Thought Content: Thought content normal.        Judgment: Judgment normal.    Lab Results  Component Value Date   TSH 1.910 08/25/2020   Lab Results  Component Value Date   WBC 6.3 07/26/2021   HGB 13.5 07/26/2021   HCT 40.0 07/26/2021   MCV 91 07/26/2021   PLT 285 07/26/2021   Lab Results  Component Value Date   NA 141 07/26/2021   K 4.5 07/26/2021   CO2 25 07/26/2021   GLUCOSE 108 (H) 07/26/2021   BUN 13 07/26/2021   CREATININE 0.87 07/26/2021   BILITOT 0.2 07/26/2021   ALKPHOS 74 07/26/2021   AST 14 07/26/2021   ALT 8 07/26/2021   PROT 6.7 07/26/2021   ALBUMIN 4.0 07/26/2021   CALCIUM 9.2 07/26/2021   ANIONGAP 8 02/29/2020   EGFR 66 07/26/2021   Lab Results  Component Value Date   CHOL 240 (H) 07/26/2021   Lab Results  Component Value Date   HDL 49 07/26/2021   Lab Results  Component Value Date   LDLCALC 160 (H) 07/26/2021   Lab Results  Component Value Date   TRIG 171 (H) 07/26/2021  Lab Results  Component Value Date   CHOLHDL 4.9 (H) 07/26/2021   Lab Results  Component Value Date   HGBA1C 5.7 07/26/2021

## 2022-02-17 DIAGNOSIS — H401221 Low-tension glaucoma, left eye, mild stage: Secondary | ICD-10-CM | POA: Diagnosis not present

## 2022-03-31 DIAGNOSIS — R350 Frequency of micturition: Secondary | ICD-10-CM | POA: Diagnosis not present

## 2022-03-31 DIAGNOSIS — N390 Urinary tract infection, site not specified: Secondary | ICD-10-CM | POA: Diagnosis not present

## 2022-04-10 ENCOUNTER — Telehealth: Payer: Self-pay

## 2022-04-10 NOTE — Chronic Care Management (AMB) (Signed)
?  Care Management  ? ?Note ? ?04/10/2022 ?Name: Debra Olsen MRN: [id number] DOB: [birth year] ? ?Debra Olsen is a 86 y.o. year old female who is a primary care patient of Loman Brooklyn, FNP. I reached out to Beulah Gandy by phone today offer care coordination services.  ? ?[name] was given information about care management services today including:  ?Care management services include personalized support from designated clinical staff supervised by her physician, including individualized plan of care and coordination with other care providers ?24/7 contact phone numbers for assistance for urgent and routine care needs. ?The patient may stop care management services at any time by phone call to the office staff. ? ?Patient did not agree to enrollment in care management services and does not wish to consider at this time. ? ?Follow up plan: ?Patient declines further follow up and engagement by the care management team. Appropriate care team members and provider have been notified via electronic communication.  ? ?Noreene Larsson, RMA ?Care Guide, Embedded Care Coordination ?[facility]  [facility]  ?[place], [place] [address] ?Direct Dial: [phone] ?[email or web address] ?Website: [email or web address]   ?

## 2022-04-11 DIAGNOSIS — L292 Pruritus vulvae: Secondary | ICD-10-CM | POA: Diagnosis not present

## 2022-04-11 DIAGNOSIS — R350 Frequency of micturition: Secondary | ICD-10-CM | POA: Diagnosis not present

## 2022-05-04 DIAGNOSIS — H401121 Primary open-angle glaucoma, left eye, mild stage: Secondary | ICD-10-CM | POA: Diagnosis not present

## 2022-07-26 ENCOUNTER — Ambulatory Visit (INDEPENDENT_AMBULATORY_CARE_PROVIDER_SITE_OTHER): Payer: Medicare Other | Admitting: Family Medicine

## 2022-07-26 ENCOUNTER — Encounter: Payer: Self-pay | Admitting: Family Medicine

## 2022-07-26 VITALS — BP 133/77 | HR 77 | Temp 97.8°F | Ht 68.0 in | Wt 170.2 lb

## 2022-07-26 DIAGNOSIS — T148XXA Other injury of unspecified body region, initial encounter: Secondary | ICD-10-CM

## 2022-07-26 DIAGNOSIS — Z8673 Personal history of transient ischemic attack (TIA), and cerebral infarction without residual deficits: Secondary | ICD-10-CM

## 2022-07-26 DIAGNOSIS — R233 Spontaneous ecchymoses: Secondary | ICD-10-CM

## 2022-07-26 DIAGNOSIS — E538 Deficiency of other specified B group vitamins: Secondary | ICD-10-CM | POA: Diagnosis not present

## 2022-07-26 DIAGNOSIS — Z7189 Other specified counseling: Secondary | ICD-10-CM | POA: Diagnosis not present

## 2022-07-26 DIAGNOSIS — R7303 Prediabetes: Secondary | ICD-10-CM

## 2022-07-26 DIAGNOSIS — Z636 Dependent relative needing care at home: Secondary | ICD-10-CM | POA: Diagnosis not present

## 2022-07-26 DIAGNOSIS — E782 Mixed hyperlipidemia: Secondary | ICD-10-CM

## 2022-07-26 LAB — BAYER DCA HB A1C WAIVED: HB A1C (BAYER DCA - WAIVED): 5.7 % — ABNORMAL HIGH (ref 4.8–5.6)

## 2022-07-26 MED ORDER — BUSPIRONE HCL 7.5 MG PO TABS: 7.5000 mg | ORAL_TABLET | Freq: Two times a day (BID) | ORAL | 2 refills | Status: DC | PRN

## 2022-07-26 MED ORDER — PRAVASTATIN SODIUM 40 MG PO TABS: 40.0000 mg | ORAL_TABLET | ORAL | 1 refills | Status: DC

## 2022-07-26 NOTE — Progress Notes (Signed)
Assessment & Plan:  Well adult exam Discussed health benefits of physical activity, and encouraged her to engage in regular exercise appropriate for her age and condition. Preventive health education provided.   Immunization History  Administered Date(s) Administered   Pneumococcal Conjugate-13 10/05/2015   Pneumococcal Polysaccharide-23 07/26/2021   Health Maintenance  Topic Date Due   URINE MICROALBUMIN  01/25/2022   INFLUENZA VACCINE  03/25/2023 (Originally 07/25/2022)   DEXA SCAN  04/02/2023   Pneumonia Vaccine 68+ Years old  Completed   HPV VACCINES  Aged Out   TETANUS/TDAP  Discontinued   COVID-19 Vaccine  Discontinued   Zoster Vaccines- Shingrix  Discontinued    1. Mixed hyperlipidemia Labs to reassess after resuming pravastatin once weekly. - pravastatin (PRAVACHOL) 40 MG tablet; Take 1 tablet (40 mg total) by mouth once a week.  Dispense: 12 tablet; Refill: 1 - CBC with Differential/Platelet - CMP14+EGFR - Lipid panel  2. History of transient ischemic attack (TIA) Continue current regimen. - pravastatin (PRAVACHOL) 40 MG tablet; Take 1 tablet (40 mg total) by mouth once a week.  Dispense: 12 tablet; Refill: 1 - Lipid panel  3. Caregiver stress Well controlled on current regimen.  - busPIRone (BUSPAR) 7.5 MG tablet; Take 1 tablet (7.5 mg total) by mouth 2 (two) times daily as needed.  Dispense: 60 tablet; Refill: 2 - CMP14+EGFR  4. Prediabetes - Bayer DCA Hb A1c Waived  5. B12 deficiency - Vitamin B12  6. ACP (advance care planning) Education and documents provided.  7. Bruising Reassurance provided that bruising is due to her Plavix.   Follow-up: Return in about 3 months (around 10/26/2022) for follow-up of chronic medication conditions with Je.   Hendricks Limes, MSN, APRN, FNP-C [facility]  Subjective:  Patient ID: Debra Olsen, female    DOB: January 08, 1936  Age: 87 y.o. MRN: [id number]  Patient Care Team: Loman Brooklyn, FNP  as PCP - General (Family Medicine)   CC:  Chief Complaint  Patient presents with   Medical Management of Chronic Issues    Bruises on bilateral lower legs     HPI Debra Olsen is a 86 y.o. female who presents today for medical management of chronic conditions. She reports consuming a general diet. Home exercise routine includes walking with the dog. She generally feels well. She reports sleeping well. She does not have additional problems to discuss today.   Vision:Within last year Dental:Receives regular dental care  Advanced Directives Patient does not have advanced directives including DNR, living will, healthcare power of attorney, financial power of attorney, and MOST form.   History of TIA/Hyperlipidemia: taking Plavix daily. Has a prescription for pravastatin 40 mg once weekly due to failure of previous statins, which she resumed six months ago and is tolerating.  Caregiver Stress: started on BuSpar as needed six months ago which she takes occasionally and reports it is helpful.     07/26/2022   10:45 AM 01/26/2022   10:27 AM 07/26/2021   10:41 AM  GAD 7 : Generalized Anxiety Score  Nervous, Anxious, on Edge _0 Control/stop worrying 0 1 1  Worry too much - different things 0 0 1  Trouble relaxing _1 Restless 0 0 0  Easily annoyed or irritable 1 0 1  Afraid - awful might happen 1 0 1  Total GAD 7 Score _2 Anxiety Difficulty Not difficult at all Not difficult at all Somewhat difficult  07/26/2022   10:44 AM 01/26/2022   10:27 AM 01/19/2022    9:09 AM  Depression screen PHQ 2/9  Decreased Interest 1 0 0  Down, Depressed, Hopeless 1 0 2  PHQ - 2 Score 2 0 2  Altered sleeping 0 0 0  Tired, decreased energy 1 0 1  Change in appetite 0 0 0  Feeling bad or failure about yourself  0 0 0  Trouble concentrating 0 0 0  Moving slowly or fidgety/restless 0 0 0  Suicidal thoughts 0 0 0  PHQ-9 Score 3 0 3  Difficult doing work/chores Not difficult at all Not  difficult at all Somewhat difficult    Review of Systems  Constitutional:  Negative for chills, fever, malaise/fatigue and weight loss.  HENT:  Negative for congestion, ear discharge, ear pain, nosebleeds, sinus pain, sore throat and tinnitus.   Eyes:  Negative for blurred vision, double vision, pain, discharge and redness.       Reading glasses.  Respiratory:  Negative for cough, shortness of breath and wheezing.   Cardiovascular:  Negative for chest pain, palpitations and leg swelling.  Gastrointestinal:  Negative for abdominal pain, constipation, diarrhea, heartburn, nausea and vomiting.  Genitourinary:  Negative for dysuria, frequency and urgency.  Musculoskeletal:  Negative for myalgias.  Skin:  Negative for rash.  Neurological:  Negative for dizziness, seizures, weakness and headaches.  Endo/Heme/Allergies:  Bruises/bleeds easily (BLE after working out in the yard).  Psychiatric/Behavioral:  Negative for depression, substance abuse and suicidal ideas. The patient is not nervous/anxious.     Current Outpatient Medications:    acetaminophen (TYLENOL) 500 MG tablet, Take 1,000 mg by mouth every 6 (six) hours as needed for mild pain., Disp: , Rfl:    busPIRone (BUSPAR) 7.5 MG tablet, Take 1 tablet (7.5 mg total) by mouth 2 (two) times daily as needed., Disp: 60 tablet, Rfl: 2   clopidogrel (PLAVIX) 75 MG tablet, TAKE 1 TABLET DAILY, Disp: 90 tablet, Rfl: 3   latanoprost (XALATAN) 0.005 % ophthalmic solution, 1 drop at bedtime., Disp: , Rfl:    MAGNESIUM PO, Take 1 tablet by mouth daily., Disp: , Rfl:    Multiple Vitamin (MULTIVITAMIN) tablet, Take 1 tablet by mouth daily., Disp: , Rfl:    pravastatin (PRAVACHOL) 40 MG tablet, Take 1 tablet (40 mg total) by mouth once a week., Disp: 12 tablet, Rfl: 1   vitamin B-12 1000 MCG tablet, Take 1 tablet (1,000 mcg total) by mouth daily., Disp: 30 tablet, Rfl: 0  Allergies  Allergen Reactions   Lidocaine Other (See Comments)    Passed out    Penicillins Rash    Past Medical History:  Diagnosis Date   Arthritis    Diverticulosis of intestine    GERD (gastroesophageal reflux disease)    Mixed hyperlipidemia    Osteopenia 04/05/2021   Prediabetes    TIA (transient ischemic attack)    Vitamin B12 deficiency     Past Surgical History:  Procedure Laterality Date   ABDOMINAL HYSTERECTOMY      Family History  Problem Relation Age of Onset   Heart failure Father    Heart disease Father    Cancer Sister    Cancer Other    Kidney disease Mother    Diabetes Brother    Bone cancer Sister    Breast cancer Sister    Alzheimer's disease Sister    Cancer Brother        unknown   Heart disease Brother  Social History   Socioeconomic History   Marital status: Married    Spouse name: Not on file   Number of children: 5   Years of education: Not on file   Highest education level: Not on file  Occupational History   Occupation: retired  Tobacco Use   Smoking status: Former    Types: Cigarettes   Smokeless tobacco: Never  Vaping Use   Vaping Use: Never used  Substance and Sexual Activity   Alcohol use: No   Drug use: No   Sexual activity: Not Currently  Other Topics Concern   Not on file  Social History Narrative   Visits with daughter, Otila Kluver a lot   Other children live out of state   Daughter in [place] is a Tax adviser and their POA   Social Determinants of Health   Financial Resource Strain: [place]  (01/19/2022)   Overall Financial Resource Strain (CARDIA)    Difficulty of Paying Living Expenses: Not very hard  Food Insecurity: No Food Insecurity (01/19/2022)   Hunger Vital Sign    Worried About Running Out of Food in the Last Year: Never true    [place] in the Last Year: Never true  Transportation Needs: No Transportation Needs (01/19/2022)   PRAPARE - Hydrologist (Medical): No    Lack of Transportation (Non-Medical): No  Physical Activity: Insufficiently Active  (01/19/2022)   Exercise Vital Sign    Days of Exercise per Week: 7 days    Minutes of Exercise per Session: 20 min  Stress: Stress Concern Present (01/19/2022)   [place]    Feeling of Stress : To some extent  Social Connections: Moderately Isolated (01/19/2022)   Social Connection and Isolation Panel [NHANES]    Frequency of Communication with Friends and Family: More than three times a week    Frequency of Social Gatherings with Friends and Family: More than three times a week    Attends Religious Services: Never    Marine scientist or Organizations: No    Attends Archivist Meetings: Never    Marital Status: Married  Human resources officer Violence: Not At Risk (01/19/2022)   Humiliation, Afraid, Rape, and Kick questionnaire    Fear of Current or Ex-Partner: No    Emotionally Abused: No    Physically Abused: No    Sexually Abused: No      Objective:    BP 133/77   Pulse 77   Temp 97.8 F (36.6 C) (Temporal)   Ht _0  (1.727 m)   Wt 170 lb 3.2 oz (77.2 kg)   SpO2 95%   BMI 25.88 kg/m   BP Readings from Last 3 Encounters:  07/26/22 133/77  01/26/22 134/76  07/26/21 132/84   Wt Readings from Last 3 Encounters:  07/26/22 170 lb 3.2 oz (77.2 kg)  01/26/22 176 lb 3.2 oz (79.9 kg)  01/19/22 181 lb (82.1 kg)    Physical Exam Vitals reviewed.  Constitutional:      General: She is not in acute distress.    Appearance: Normal appearance. She is normal weight. She is not ill-appearing, toxic-appearing or diaphoretic.  HENT:     Head: Normocephalic and atraumatic.     Right Ear: Tympanic membrane, ear canal and external ear normal. There is no impacted cerumen.     Left Ear: Tympanic membrane, ear canal and external ear normal. There is no impacted cerumen.  Nose: Nose normal. No congestion or rhinorrhea.     Mouth/Throat:     Mouth: Mucous membranes are moist.     Pharynx: Oropharynx is  clear. No oropharyngeal exudate or posterior oropharyngeal erythema.  Eyes:     General: No scleral icterus.       Right eye: No discharge.        Left eye: No discharge.     Conjunctiva/sclera: Conjunctivae normal.     Pupils: Pupils are equal, round, and reactive to light.  Cardiovascular:     Rate and Rhythm: Normal rate and regular rhythm.     Heart sounds: Normal heart sounds. No murmur heard.    No friction rub. No gallop.  Pulmonary:     Effort: Pulmonary effort is normal. No respiratory distress.     Breath sounds: Normal breath sounds. No stridor. No wheezing, rhonchi or rales.  Abdominal:     General: Abdomen is flat. Bowel sounds are normal. There is no distension.     Palpations: Abdomen is soft. There is no hepatomegaly, splenomegaly or mass.     Tenderness: There is no abdominal tenderness. There is no guarding or rebound.     Hernia: No hernia is present.  Musculoskeletal:        General: Normal range of motion.     Cervical back: Normal range of motion and neck supple. No rigidity. No muscular tenderness.  Lymphadenopathy:     Cervical: No cervical adenopathy.  Skin:    General: Skin is warm and dry.     Capillary Refill: Capillary refill takes less than 2 seconds.     Findings: Bruising (multiple on BLE) present.  Neurological:     General: No focal deficit present.     Mental Status: She is alert and oriented to person, place, and time. Mental status is at baseline.  Psychiatric:        Mood and Affect: Mood normal.        Behavior: Behavior normal.        Thought Content: Thought content normal.        Judgment: Judgment normal.    Lab Results  Component Value Date   TSH 1.910 08/25/2020   Lab Results  Component Value Date   WBC 5.7 01/26/2022   HGB 14.0 01/26/2022   HCT 42.5 01/26/2022   MCV 93 01/26/2022   PLT 299 01/26/2022   Lab Results  Component Value Date   NA 141 01/26/2022   K 4.4 01/26/2022   CO2 24 01/26/2022   GLUCOSE 102 (H)  01/26/2022   BUN 10 01/26/2022   CREATININE 0.79 01/26/2022   BILITOT 0.2 01/26/2022   ALKPHOS 78 01/26/2022   AST 15 01/26/2022   ALT 9 01/26/2022   PROT 7.2 01/26/2022   ALBUMIN 4.2 01/26/2022   CALCIUM 9.2 01/26/2022   ANIONGAP 8 02/29/2020   EGFR 74 01/26/2022   Lab Results  Component Value Date   CHOL 252 (H) 01/26/2022   Lab Results  Component Value Date   HDL 53 01/26/2022   Lab Results  Component Value Date   LDLCALC 170 (H) 01/26/2022   Lab Results  Component Value Date   TRIG 158 (H) 01/26/2022   Lab Results  Component Value Date   CHOLHDL 4.8 (H) 01/26/2022   Lab Results  Component Value Date   HGBA1C 5.7 (H) 01/26/2022

## 2022-07-27 LAB — CMP14+EGFR
ALT: 9 IU/L (ref 0–32)
AST: 17 IU/L (ref 0–40)
Albumin/Globulin Ratio: 1.4 (ref 1.2–2.2)
Albumin: 4.1 g/dL (ref 3.7–4.7)
Alkaline Phosphatase: 74 IU/L (ref 44–121)
BUN/Creatinine Ratio: 12 (ref 12–28)
BUN: 10 mg/dL (ref 8–27)
Bilirubin Total: 0.3 mg/dL (ref 0.0–1.2)
CO2: 19 mmol/L — ABNORMAL LOW (ref 20–29)
Calcium: 9.2 mg/dL (ref 8.7–10.3)
Chloride: 104 mmol/L (ref 96–106)
Creatinine, Ser: 0.81 mg/dL (ref 0.57–1.00)
Globulin, Total: 3 g/dL (ref 1.5–4.5)
Glucose: 105 mg/dL — ABNORMAL HIGH (ref 70–99)
Potassium: 4.5 mmol/L (ref 3.5–5.2)
Sodium: 142 mmol/L (ref 134–144)
Total Protein: 7.1 g/dL (ref 6.0–8.5)
eGFR: 72 mL/min/{1.73_m2} (ref >59)

## 2022-07-27 LAB — LIPID PANEL
Chol/HDL Ratio: 4 ratio (ref 0.0–4.4)
Cholesterol, Total: 242 mg/dL — ABNORMAL HIGH (ref 100–199)
HDL: 61 mg/dL (ref >39)
LDL Chol Calc (NIH): 157 mg/dL — ABNORMAL HIGH (ref 0–99)
Triglycerides: 136 mg/dL (ref 0–149)
VLDL Cholesterol Cal: 24 mg/dL (ref 5–40)

## 2022-07-27 LAB — CBC WITH DIFFERENTIAL/PLATELET
Basophils Absolute: 0.2 10*3/uL (ref 0.0–0.2)
Basos: 2 %
EOS (ABSOLUTE): 0.3 10*3/uL (ref 0.0–0.4)
Eos: 4 %
Hematocrit: 42.7 % (ref 34.0–46.6)
Hemoglobin: 14.1 g/dL (ref 11.1–15.9)
Immature Grans (Abs): 0 10*3/uL (ref 0.0–0.1)
Immature Granulocytes: 0 %
Lymphocytes Absolute: 1.9 10*3/uL (ref 0.7–3.1)
Lymphs: 28 %
MCH: 30.3 pg (ref 26.6–33.0)
MCHC: 33 g/dL (ref 31.5–35.7)
MCV: 92 fL (ref 79–97)
Monocytes Absolute: 0.6 10*3/uL (ref 0.1–0.9)
Monocytes: 9 %
Neutrophils Absolute: 3.8 10*3/uL (ref 1.4–7.0)
Neutrophils: 57 %
Platelets: 302 10*3/uL (ref 150–450)
RBC: 4.66 x10E6/uL (ref 3.77–5.28)
RDW: 12.1 % (ref 11.7–15.4)
WBC: 6.7 10*3/uL (ref 3.4–10.8)

## 2022-07-27 LAB — VITAMIN B12: Vitamin B-12: 571 pg/mL (ref 232–1245)

## 2022-09-26 ENCOUNTER — Encounter: Payer: Self-pay | Admitting: *Deleted

## 2022-10-24 ENCOUNTER — Ambulatory Visit (INDEPENDENT_AMBULATORY_CARE_PROVIDER_SITE_OTHER): Payer: Medicare Other | Admitting: Nurse Practitioner

## 2022-10-24 ENCOUNTER — Other Ambulatory Visit: Payer: Self-pay | Admitting: Nurse Practitioner

## 2022-10-24 ENCOUNTER — Encounter: Payer: Self-pay | Admitting: Nurse Practitioner

## 2022-10-24 VITALS — BP 136/83 | HR 82 | Temp 97.8°F | Ht 68.0 in | Wt 170.0 lb

## 2022-10-24 DIAGNOSIS — N898 Other specified noninflammatory disorders of vagina: Secondary | ICD-10-CM

## 2022-10-24 DIAGNOSIS — E782 Mixed hyperlipidemia: Secondary | ICD-10-CM | POA: Diagnosis not present

## 2022-10-24 DIAGNOSIS — N390 Urinary tract infection, site not specified: Secondary | ICD-10-CM | POA: Diagnosis not present

## 2022-10-24 DIAGNOSIS — R8290 Unspecified abnormal findings in urine: Secondary | ICD-10-CM

## 2022-10-24 LAB — MICROSCOPIC EXAMINATION: Renal Epithel, UA: NONE SEEN /hpf

## 2022-10-24 LAB — URINALYSIS, ROUTINE W REFLEX MICROSCOPIC
Bilirubin, UA: NEGATIVE
Glucose, UA: NEGATIVE
Ketones, UA: NEGATIVE
Nitrite, UA: NEGATIVE
Protein,UA: NEGATIVE
Specific Gravity, UA: 1.015 (ref 1.005–1.030)
Urobilinogen, Ur: 0.2 mg/dL (ref 0.2–1.0)
pH, UA: 7 (ref 5.0–7.5)

## 2022-10-24 MED ORDER — SULFAMETHOXAZOLE-TRIMETHOPRIM 800-160 MG PO TABS: 1.0000 | ORAL_TABLET | Freq: Two times a day (BID) | ORAL | 0 refills | Status: DC

## 2022-10-24 NOTE — Assessment & Plan Note (Signed)
Labs completed results pending.  Continue current medication pravastatin 40 mg tablet by mouth once a week.

## 2022-10-24 NOTE — Progress Notes (Signed)
Established Patient Office Visit  Subjective   Patient ID: Debra Olsen, female    DOB: 06/24/1936  Age: 86 y.o. MRN: [id number]  Chief Complaint  Patient presents with   Medical Management of Chronic Issues    3 month    Vaginal Itching    Been going on for months     Vaginal Itching The patient's pertinent negatives include no genital itching or genital lesions. This is a new problem. The current episode started in the past 7 days. The problem occurs constantly. The problem has been unchanged. The pain is mild. The problem affects both sides. She is not pregnant. Pertinent negatives include no abdominal pain, back pain, chills, constipation, fever, flank pain, hematuria or rash. Nothing aggravates the symptoms. She has tried nothing for the symptoms. The treatment provided no relief. She is not sexually active. She is postmenopausal.  Hyperlipidemia This is a chronic problem. The current episode started more than 1 year ago. The problem is controlled. Recent lipid tests were reviewed and are high. Associated symptoms include leg pain. Pertinent negatives include no chest pain or focal sensory loss. Current antihyperlipidemic treatment includes statins. The current treatment provides moderate improvement of lipids. Risk factors for coronary artery disease include dyslipidemia and post-menopausal.    Patient Active Problem List   Diagnosis Date Noted   Caregiver stress 01/26/2022   Chronic pain of right knee 01/26/2022   Hemorrhoids, external 01/26/2022   Osteopenia 04/05/2021   Prediabetes 08/30/2020   Mixed hyperlipidemia    History of transient ischemic attack (TIA) 02/29/2020   Recurrent UTI 02/29/2020   B12 deficiency 02/29/2020   Past Medical History:  Diagnosis Date   Arthritis    Diverticulosis of intestine    GERD (gastroesophageal reflux disease)    Mixed hyperlipidemia    Osteopenia 04/05/2021   Prediabetes    TIA (transient ischemic attack)    Vitamin B12  deficiency    Past Surgical History:  Procedure Laterality Date   ABDOMINAL HYSTERECTOMY     Social History   Tobacco Use   Smoking status: Former    Types: Cigarettes   Smokeless tobacco: Never  Vaping Use   Vaping Use: Never used  Substance Use Topics   Alcohol use: No   Drug use: No   Social History   Socioeconomic History   Marital status: Married    Spouse name: Not on file   Number of children: 5   Years of education: Not on file   Highest education level: Not on file  Occupational History   Occupation: retired  Tobacco Use   Smoking status: Former    Types: Cigarettes   Smokeless tobacco: Never  Vaping Use   Vaping Use: Never used  Substance and Sexual Activity   Alcohol use: No   Drug use: No   Sexual activity: Not Currently  Other Topics Concern   Not on file  Social History Narrative   Visits with daughter, Otila Kluver a lot   Other children live out of state   Daughter in [place] is a Tax adviser and their POA   Social Determinants of Health   Financial Resource Strain: Low Risk  (01/19/2022)   Overall Financial Resource Strain (CARDIA)    Difficulty of Paying Living Expenses: Not very hard  Food Insecurity: No Food Insecurity (01/19/2022)   Hunger Vital Sign    Worried About Running Out of Food in the Last Year: Never true    [place] in the Last Year:  Never true  Transportation Needs: No Transportation Needs (01/19/2022)   PRAPARE - Hydrologist (Medical): No    Lack of Transportation (Non-Medical): No  Physical Activity: Insufficiently Active (01/19/2022)   Exercise Vital Sign    Days of Exercise per Week: 7 days    Minutes of Exercise per Session: 20 min  Stress: Stress Concern Present (01/19/2022)   [place]    Feeling of Stress : To some extent  Social Connections: Moderately Isolated (01/19/2022)   Social Connection and Isolation Panel [NHANES]     Frequency of Communication with Friends and Family: More than three times a week    Frequency of Social Gatherings with Friends and Family: More than three times a week    Attends Religious Services: Never    Marine scientist or Organizations: No    Attends Archivist Meetings: Never    Marital Status: Married  Human resources officer Violence: Not At Risk (01/19/2022)   Humiliation, Afraid, Rape, and Kick questionnaire    Fear of Current or Ex-Partner: No    Emotionally Abused: No    Physically Abused: No    Sexually Abused: No   Family Status  Relation Name Status   Father  Deceased   Sister 1 Alive   Other  (Not Specified)   Mother  Deceased   Brother 2 Alive   Daughter 36 Alive   Son 74 Alive   MGM  Deceased   MGF  Deceased   PGM  Deceased   PGF  Deceased   Sister 2 Deceased   Brother 1 Deceased   Family History  Problem Relation Age of Onset   Heart failure Father    Heart disease Father    Cancer Sister    Cancer Other    Kidney disease Mother    Diabetes Brother    Bone cancer Sister    Breast cancer Sister    Alzheimer's disease Sister    Cancer Brother        unknown   Heart disease Brother    Allergies  Allergen Reactions   Lidocaine Other (See Comments)    Passed out   Penicillins Rash      Review of Systems  Constitutional: Negative.  Negative for chills and fever.  HENT: Negative.    Eyes: Negative.   Respiratory: Negative.    Cardiovascular: Negative.  Negative for chest pain.  Gastrointestinal:  Negative for abdominal pain and constipation.  Genitourinary:  Negative for flank pain and hematuria.  Musculoskeletal:  Negative for back pain.  Skin: Negative.  Negative for rash.  All other systems reviewed and are negative.     Objective:     BP 136/83   Pulse 82   Temp 97.8 F (36.6 C)   Ht 5' 8" (1.727 m)   Wt 170 lb (77.1 kg)   SpO2 97%   BMI 25.85 kg/m  BP Readings from Last 3 Encounters:  10/24/22 136/83  07/26/22  133/77  01/26/22 134/76   Wt Readings from Last 3 Encounters:  10/24/22 170 lb (77.1 kg)  07/26/22 170 lb 3.2 oz (77.2 kg)  01/26/22 176 lb 3.2 oz (79.9 kg)      Physical Exam Vitals and nursing note reviewed.  Constitutional:      Appearance: Normal appearance.  HENT:     Head: Normocephalic.     Right Ear: External ear normal.     Left  Ear: External ear normal.     Nose: Nose normal.     Mouth/Throat:     Mouth: Mucous membranes are moist.     Pharynx: Oropharynx is clear.  Eyes:     Conjunctiva/sclera: Conjunctivae normal.  Cardiovascular:     Rate and Rhythm: Normal rate and regular rhythm.     Pulses: Normal pulses.     Heart sounds: Normal heart sounds.  Pulmonary:     Effort: Pulmonary effort is normal.     Breath sounds: Normal breath sounds.  Abdominal:     General: Bowel sounds are normal.  Musculoskeletal:     Comments: Mild intermittent foot pain  Skin:    General: Skin is warm.     Findings: No erythema or rash.  Neurological:     General: No focal deficit present.     Mental Status: She is alert and oriented to person, place, and time.  Psychiatric:        Behavior: Behavior normal.      No results found for any visits on 10/24/22.  Last CBC Lab Results  Component Value Date   WBC 6.7 07/26/2022   HGB 14.1 07/26/2022   HCT 42.7 07/26/2022   MCV 92 07/26/2022   MCH 30.3 07/26/2022   RDW 12.1 07/26/2022   PLT 302 21/30/8657   Last metabolic panel Lab Results  Component Value Date   GLUCOSE 105 (H) 07/26/2022   NA 142 07/26/2022   K 4.5 07/26/2022   CL 104 07/26/2022   CO2 19 (L) 07/26/2022   BUN 10 07/26/2022   CREATININE 0.81 07/26/2022   EGFR 72 07/26/2022   CALCIUM 9.2 07/26/2022   PROT 7.1 07/26/2022   ALBUMIN 4.1 07/26/2022   LABGLOB 3.0 07/26/2022   AGRATIO 1.4 07/26/2022   BILITOT 0.3 07/26/2022   ALKPHOS 74 07/26/2022   AST 17 07/26/2022   ALT 9 07/26/2022   ANIONGAP 8 02/29/2020   Last lipids Lab Results   Component Value Date   CHOL 242 (H) 07/26/2022   HDL 61 07/26/2022   LDLCALC 157 (H) 07/26/2022   TRIG 136 07/26/2022   CHOLHDL 4.0 07/26/2022   Last hemoglobin A1c Lab Results  Component Value Date   HGBA1C 5.7 (H) 07/26/2022   Last thyroid functions Lab Results  Component Value Date   TSH 1.910 08/25/2020   Last vitamin D No results found for: "25OHVITD2", "25OHVITD3", "VD25OH" Last vitamin B12 and Folate Lab Results  Component Value Date   QIONGEXB28 413 07/26/2022      The ASCVD Risk score (Arnett DK, et al., 2019) failed to calculate for the following reasons:   The 2019 ASCVD risk score is only valid for ages 66 to 108    Assessment & Plan:  For vaginal itching completed urinalysis and wet prep results pending.  Advised patient to discontinue vaginocele, harsh soaps.  Recommended refresh over-the-counter to restore moisture  Patient is to follow-up with unresolved symptoms. Problem List Items Addressed This Visit       Other   Mixed hyperlipidemia    Labs completed results pending.  Continue current medication pravastatin 40 mg tablet by mouth once a week.      Relevant Orders   Lipid Panel   CMP14+EGFR   Other Visit Diagnoses     Vaginal itching    -  Primary   Relevant Orders   Urinalysis, Routine w reflex microscopic   CULTURE, URINE COMPREHENSIVE   WET PREP FOR [place], YEAST, CLUE  Return in about 6 months (around 04/24/2023).    Ivy Lynn, NP

## 2022-10-24 NOTE — Patient Instructions (Signed)

## 2022-10-25 LAB — CMP14+EGFR
ALT: 11 IU/L (ref 0–32)
AST: 18 IU/L (ref 0–40)
Albumin/Globulin Ratio: 1.5 (ref 1.2–2.2)
Albumin: 4.2 g/dL (ref 3.7–4.7)
Alkaline Phosphatase: 73 IU/L (ref 44–121)
BUN/Creatinine Ratio: 15 (ref 12–28)
BUN: 13 mg/dL (ref 8–27)
Bilirubin Total: 0.3 mg/dL (ref 0.0–1.2)
CO2: 26 mmol/L (ref 20–29)
Calcium: 9 mg/dL (ref 8.7–10.3)
Chloride: 102 mmol/L (ref 96–106)
Creatinine, Ser: 0.88 mg/dL (ref 0.57–1.00)
Globulin, Total: 2.8 g/dL (ref 1.5–4.5)
Glucose: 88 mg/dL (ref 70–99)
Potassium: 4.3 mmol/L (ref 3.5–5.2)
Sodium: 140 mmol/L (ref 134–144)
Total Protein: 7 g/dL (ref 6.0–8.5)
eGFR: 65 mL/min/{1.73_m2} (ref >59)

## 2022-10-25 LAB — LIPID PANEL
Chol/HDL Ratio: 4 ratio (ref 0.0–4.4)
Cholesterol, Total: 245 mg/dL — ABNORMAL HIGH (ref 100–199)
HDL: 61 mg/dL (ref >39)
LDL Chol Calc (NIH): 165 mg/dL — ABNORMAL HIGH (ref 0–99)
Triglycerides: 110 mg/dL (ref 0–149)
VLDL Cholesterol Cal: 19 mg/dL (ref 5–40)

## 2022-10-25 LAB — WET PREP FOR TRICH, YEAST, CLUE
Clue Cell Exam: NEGATIVE
Trichomonas Exam: NEGATIVE
Yeast Exam: NEGATIVE

## 2022-10-27 ENCOUNTER — Ambulatory Visit: Payer: Medicare Other | Admitting: Nurse Practitioner

## 2022-10-29 LAB — CULTURE, URINE COMPREHENSIVE

## 2022-11-07 DIAGNOSIS — R350 Frequency of micturition: Secondary | ICD-10-CM | POA: Diagnosis not present

## 2022-11-28 DIAGNOSIS — R350 Frequency of micturition: Secondary | ICD-10-CM | POA: Diagnosis not present

## 2022-11-28 DIAGNOSIS — N939 Abnormal uterine and vaginal bleeding, unspecified: Secondary | ICD-10-CM | POA: Diagnosis not present

## 2022-11-28 DIAGNOSIS — N390 Urinary tract infection, site not specified: Secondary | ICD-10-CM | POA: Diagnosis not present

## 2022-11-28 DIAGNOSIS — N341 Nonspecific urethritis: Secondary | ICD-10-CM | POA: Diagnosis not present

## 2023-01-26 DIAGNOSIS — Z0000 Encounter for general adult medical examination without abnormal findings: Secondary | ICD-10-CM | POA: Diagnosis not present

## 2023-01-26 DIAGNOSIS — Z90710 Acquired absence of both cervix and uterus: Secondary | ICD-10-CM | POA: Diagnosis not present

## 2023-01-26 DIAGNOSIS — N9089 Other specified noninflammatory disorders of vulva and perineum: Secondary | ICD-10-CM | POA: Diagnosis not present

## 2023-01-26 DIAGNOSIS — N939 Abnormal uterine and vaginal bleeding, unspecified: Secondary | ICD-10-CM | POA: Diagnosis not present

## 2023-02-05 ENCOUNTER — Other Ambulatory Visit: Payer: Self-pay | Admitting: Family Medicine

## 2023-02-05 DIAGNOSIS — G459 Transient cerebral ischemic attack, unspecified: Secondary | ICD-10-CM

## 2023-02-12 ENCOUNTER — Ambulatory Visit (INDEPENDENT_AMBULATORY_CARE_PROVIDER_SITE_OTHER): Payer: Medicare Other

## 2023-02-12 VITALS — Ht 68.0 in | Wt 170.0 lb

## 2023-02-12 DIAGNOSIS — Z0000 Encounter for general adult medical examination without abnormal findings: Secondary | ICD-10-CM

## 2023-02-12 NOTE — Progress Notes (Signed)
Subjective:   Debra Olsen is a 87 y.o. female who presents for Medicare Annual (Subsequent) preventive examination. I connected with  Raegann Maiorana on 02/12/23 by a audio enabled telemedicine application and verified that I am speaking with the correct person using two identifiers.  Patient Location: Home  Provider Location: Home Office  I discussed the limitations of evaluation and management by telemedicine. The patient expressed understanding and agreed to proceed.  Review of Systems     Cardiac Risk Factors include: advanced age (>66mn, >>36women)     Objective:    Today's Vitals   02/12/23 0947  Weight: 170 lb (77.1 kg)  Height: 5' 8"$  (1.727 m)   Body mass index is 25.85 kg/m.     02/12/2023    9:50 AM 01/19/2022    9:11 AM 01/18/2021    8:49 AM 02/29/2020   12:45 AM 03/08/2018   11:24 AM 03/25/2016   10:27 AM 04/05/2015    7:37 PM  Advanced Directives  Does Patient Have a Medical Advance Directive? No Yes Yes No No No No  Type of AArmed forces technical officerof [place]Living will      Does patient want to make changes to medical advance directive?   No - Patient declined      Copy of HEdenin Chart?  No - copy requested       Would patient like information on creating a medical advance directive? No - Patient declined   No - Patient declined  No - patient declined information No - patient declined information    Current Medications (verified) Outpatient Encounter Medications as of 02/12/2023  Medication Sig   acetaminophen (TYLENOL) 500 MG tablet Take 1,000 mg by mouth every 6 (six) hours as needed for mild pain.   busPIRone (BUSPAR) 7.5 MG tablet Take 1 tablet (7.5 mg total) by mouth 2 (two) times daily as needed.   clopidogrel (PLAVIX) 75 MG tablet TAKE 1 TABLET BY MOUTH EVERY DAY   latanoprost (XALATAN) 0.005 % ophthalmic solution 1 drop at bedtime.   MAGNESIUM PO Take 1 tablet by mouth daily.    Multiple Vitamin (MULTIVITAMIN) tablet Take 1 tablet by mouth daily.   pravastatin (PRAVACHOL) 40 MG tablet Take 1 tablet (40 mg total) by mouth once a week.   vitamin B-12 1000 MCG tablet Take 1 tablet (1,000 mcg total) by mouth daily.   sulfamethoxazole-trimethoprim (BACTRIM DS) 800-160 MG tablet Take 1 tablet by mouth 2 (two) times daily. (Patient not taking: Reported on 02/12/2023)   No facility-administered encounter medications on file as of 02/12/2023.    Allergies (verified) Lidocaine and Penicillins   History: Past Medical History:  Diagnosis Date   Arthritis    Diverticulosis of intestine    GERD (gastroesophageal reflux disease)    Mixed hyperlipidemia    Osteopenia 04/05/2021   Prediabetes    TIA (transient ischemic attack)    Vitamin B12 deficiency    Past Surgical History:  Procedure Laterality Date   ABDOMINAL HYSTERECTOMY     Family History  Problem Relation Age of Onset   Heart failure Father    Heart disease Father    Cancer Sister    Cancer Other    Kidney disease Mother    Diabetes Brother    Bone cancer Sister    Breast cancer Sister    Alzheimer's disease Sister    Cancer Brother        unknown  Heart disease Brother    Social History   Socioeconomic History   Marital status: Married    Spouse name: Not on file   Number of children: 5   Years of education: Not on file   Highest education level: Not on file  Occupational History   Occupation: retired  Tobacco Use   Smoking status: Former    Types: Cigarettes   Smokeless tobacco: Never  Scientific laboratory technician Use: Never used  Substance and Sexual Activity   Alcohol use: No   Drug use: No   Sexual activity: Not Currently  Other Topics Concern   Not on file  Social History Narrative   Visits with daughter, Otila Kluver a lot   Other children live out of state   Daughter in [place] is a Tax adviser and their POA   Social Determinants of Health   Financial Resource Strain: [place]  (02/12/2023)    Overall Financial Resource Strain (CARDIA)    Difficulty of Paying Living Expenses: Not hard at all  Food Insecurity: No Food Insecurity (02/12/2023)   Hunger Vital Sign    Worried About Running Out of Food in the Last Year: Never true    [place] in the Last Year: Never true  Transportation Needs: No Transportation Needs (02/12/2023)   PRAPARE - Hydrologist (Medical): No    Lack of Transportation (Non-Medical): No  Physical Activity: Insufficiently Active (02/12/2023)   Exercise Vital Sign    Days of Exercise per Week: 3 days    Minutes of Exercise per Session: 30 min  Stress: No Stress Concern Present (02/12/2023)   [place]    Feeling of Stress : Not at all  Social Connections: Socially Isolated (02/12/2023)   Social Connection and Isolation Panel [NHANES]    Frequency of Communication with Friends and Family: More than three times a week    Frequency of Social Gatherings with Friends and Family: More than three times a week    Attends Religious Services: Never    Marine scientist or Organizations: No    Attends Archivist Meetings: Never    Marital Status: Widowed    Tobacco Counseling Counseling given: Not Answered   Clinical Intake:  Pre-visit preparation completed: Yes  Pain : No/denies pain     Nutritional Risks: None Diabetes: No  How often do you need to have someone help you when you read instructions, pamphlets, or other written materials from your doctor or pharmacy?: 1 - Never  Diabetic?no   Interpreter Needed?: No  Information entered by :: Jadene Pierini, LPN   Activities of Daily Living    02/12/2023    9:50 AM  In your present state of health, do you have any difficulty performing the following activities:  Hearing? 0  Vision? 0  Difficulty concentrating or making decisions? 0  Walking or climbing stairs? 0  Dressing or  bathing? 0  Doing errands, shopping? 0  Preparing Food and eating ? N  Using the Toilet? N  In the past six months, have you accidently leaked urine? N  Do you have problems with loss of bowel control? N  Managing your Medications? N  Managing your Finances? N  Housekeeping or managing your Housekeeping? N    Patient Care Team: Ivy Lynn, NP (Inactive) as PCP - General (Nurse Practitioner)  Indicate any recent Medical Services you may have received from other  than [facility] providers in the past year (date may be approximate).     Assessment:   This is a routine wellness examination for Deyja.  Hearing/Vision screen Vision Screening - Comments:: Wears rx glasses - up to date with routine eye exams with  Dr.Davis   Dietary issues and exercise activities discussed: Current Exercise Habits: Home exercise routine, Type of exercise: walking, Time (Minutes): 30, Frequency (Times/Week): 3, Weekly Exercise (Minutes/Week): 90, Intensity: Mild, Exercise limited by: None identified   Goals Addressed             This Visit's Progress    DIET - EAT MORE FRUITS AND VEGETABLES   On track      Depression Screen    02/12/2023    9:49 AM 10/24/2022   11:16 AM 07/26/2022   10:44 AM 01/26/2022   10:27 AM 01/19/2022    9:09 AM 07/26/2021   10:41 AM 01/25/2021    1:47 PM  PHQ 2/9 Scores  PHQ - 2 Score 0 0 2 0 2 0 0  PHQ- 9 Score  0 3 0 3 2     Fall Risk    02/12/2023    9:47 AM 10/24/2022   11:16 AM 07/26/2022   10:44 AM 01/26/2022   10:27 AM 01/19/2022    9:11 AM  Fall Risk   Falls in the past year? 0 0 0 1 0  Number falls in past yr: 0 0  0 0  Injury with Fall? 0 0  1 0  Risk for fall due to : No Fall Risks    Other (Comment)  Risk for fall due to: Comment     hx of TIA  Follow up Falls prevention discussed Falls evaluation completed  Falls prevention discussed Falls prevention discussed    FALL RISK PREVENTION PERTAINING TO THE HOME:  Any stairs in or around the home? No  If  so, are there any without handrails? No  Home free of loose throw rugs in walkways, pet beds, electrical cords, etc? Yes  Adequate lighting in your home to reduce risk of falls? Yes   ASSISTIVE DEVICES UTILIZED TO PREVENT FALLS:  Life alert? No  Use of a cane, walker or w/c? No  Grab bars in the bathroom? No  Shower chair or bench in shower? No  Elevated toilet seat or a handicapped toilet? No       02/12/2023    9:50 AM 01/19/2022    9:34 AM 01/18/2021    8:50 AM  6CIT Screen  What Year? 0 points 0 points 0 points  What month? 0 points 0 points 0 points  What time? 0 points 0 points 3 points  Count back from 20 0 points 0 points 0 points  Months in reverse 0 points 0 points 0 points  Repeat phrase 0 points 4 points 10 points  Total Score 0 points 4 points 13 points    Immunizations Immunization History  Administered Date(s) Administered   Pneumococcal Conjugate-13 10/05/2015   Pneumococcal Polysaccharide-23 07/26/2021    TDAP status: Due, Education has been provided regarding the importance of this vaccine. Advised may receive this vaccine at local pharmacy or Health Dept. Aware to provide a copy of the vaccination record if obtained from local pharmacy or Health Dept. Verbalized acceptance and understanding.  Flu Vaccine status: Declined, Education has been provided regarding the importance of this vaccine but patient still declined. Advised may receive this vaccine at local pharmacy or Health Dept. Aware to provide  a copy of the vaccination record if obtained from local pharmacy or Health Dept. Verbalized acceptance and understanding.  Pneumococcal vaccine status: Up to date  Covid-19 vaccine status: Declined, Education has been provided regarding the importance of this vaccine but patient still declined. Advised may receive this vaccine at local pharmacy or Health Dept.or vaccine clinic. Aware to provide a copy of the vaccination record if obtained from local pharmacy or  Health Dept. Verbalized acceptance and understanding.  Qualifies for Shingles Vaccine? Yes   Zostavax completed No   Shingrix Completed?: No.    Education has been provided regarding the importance of this vaccine. Patient has been advised to call insurance company to determine out of pocket expense if they have not yet received this vaccine. Advised may also receive vaccine at local pharmacy or Health Dept. Verbalized acceptance and understanding.  Screening Tests Health Maintenance  Topic Date Due   DTaP/Tdap/Td (1 - Tdap) Never done   INFLUENZA VACCINE  03/25/2023 (Originally 07/25/2022)   DEXA SCAN  04/02/2023   Medicare Annual Wellness (AWV)  02/13/2024   Pneumonia Vaccine 52+ Years old  Completed   HPV VACCINES  Aged Out   COVID-19 Vaccine  Discontinued   Zoster Vaccines- Shingrix  Discontinued    Health Maintenance  Health Maintenance Due  Topic Date Due   DTaP/Tdap/Td (1 - Tdap) Never done    Colorectal cancer screening: No longer required.   Mammogram status: No longer required due to age.  Bone Density status: Completed 04/01/2021. Results reflect: Bone density results: OSTEOPOROSIS. Repeat every 2 years.  Lung Cancer Screening: (Low Dose CT Chest recommended if Age 76-80 years, 30 pack-year currently smoking OR have quit w/in 15years.) does not qualify.   Lung Cancer Screening Referral: n/a  Additional Screening:  Hepatitis C Screening: does not qualify;  Vision Screening: Recommended annual ophthalmology exams for early detection of glaucoma and other disorders of the eye. Is the patient up to date with their annual eye exam?  Yes  Who is the provider or what is the name of the office in which the patient attends annual eye exams? Dr.Davis  If pt is not established with a provider, would they like to be referred to a provider to establish care? No .   Dental Screening: Recommended annual dental exams for proper oral hygiene  Community Resource Referral /  Chronic Care Management: CRR required this visit?  No   CCM required this visit?  No      Plan:     I have personally reviewed and noted the following in the patient's chart:   Medical and social history Use of alcohol, tobacco or illicit drugs  Current medications and supplements including opioid prescriptions. Patient is not currently taking opioid prescriptions. Functional ability and status Nutritional status Physical activity Advanced directives List of other physicians Hospitalizations, surgeries, and ER visits in previous 12 months Vitals Screenings to include cognitive, depression, and falls Referrals and appointments  In addition, I have reviewed and discussed with patient certain preventive protocols, quality metrics, and best practice recommendations. A written personalized care plan for preventive services as well as general preventive health recommendations were provided to patient.     Daphane Shepherd, LPN   [id number]   Nurse Notes: Due TDAp Vaccine

## 2023-02-12 NOTE — Patient Instructions (Signed)
Debra Olsen , Thank you for taking time to come for your Medicare Wellness Visit. I appreciate your ongoing commitment to your health goals. Please review the following plan we discussed and let me know if I can assist you in the future.   These are the goals we discussed:  Goals      DIET - EAT MORE FRUITS AND VEGETABLES        This is a list of the screening recommended for you and due dates:  Health Maintenance  Topic Date Due   DTaP/Tdap/Td vaccine (1 - Tdap) Never done   Flu Shot  03/25/2023*   DEXA scan (bone density measurement)  04/02/2023   Medicare Annual Wellness Visit  02/13/2024   Pneumonia Vaccine  Completed   HPV Vaccine  Aged Out   COVID-19 Vaccine  Discontinued   Zoster (Shingles) Vaccine  Discontinued  *Topic was postponed. The date shown is not the original due date.    Advanced directives: Advance directive discussed with you today. I have provided a copy for you to complete at home and have notarized. Once this is complete please bring a copy in to our office so we can scan it into your chart.   Conditions/risks identified: Aim for 30 minutes of exercise or brisk walking, 6-8 glasses of water, and 5 servings of fruits and vegetables each day.   Next appointment: Follow up in one year for your annual wellness visit    Preventive Care 65 Years and Older, Female Preventive care refers to lifestyle choices and visits with your health care provider that can promote health and wellness. What does preventive care include? A yearly physical exam. This is also called an annual well check. Dental exams once or twice a year. Routine eye exams. Ask your health care provider how often you should have your eyes checked. Personal lifestyle choices, including: Daily care of your teeth and gums. Regular physical activity. Eating a healthy diet. Avoiding tobacco and drug use. Limiting alcohol use. Practicing safe sex. Taking low-dose aspirin every day. Taking vitamin  and mineral supplements as recommended by your health care provider. What happens during an annual well check? The services and screenings done by your health care provider during your annual well check will depend on your age, overall health, lifestyle risk factors, and family history of disease. Counseling  Your health care provider may ask you questions about your: Alcohol use. Tobacco use. Drug use. Emotional well-being. Home and relationship well-being. Sexual activity. Eating habits. History of falls. Memory and ability to understand (cognition). Work and work Statistician. Reproductive health. Screening  You may have the following tests or measurements: Height, weight, and BMI. Blood pressure. Lipid and cholesterol levels. These may be checked every 5 years, or more frequently if you are over 8 years old. Skin check. Lung cancer screening. You may have this screening every year starting at age 60 if you have a 30-pack-year history of smoking and currently smoke or have quit within the past 15 years. Fecal occult blood test (FOBT) of the stool. You may have this test every year starting at age 17. Flexible sigmoidoscopy or colonoscopy. You may have a sigmoidoscopy every 5 years or a colonoscopy every 10 years starting at age 43. Hepatitis C blood test. Hepatitis B blood test. Sexually transmitted disease (STD) testing. Diabetes screening. This is done by checking your blood sugar (glucose) after you have not eaten for a while (fasting). You may have this done every 1-3 years. Bone density  scan. This is done to screen for osteoporosis. You may have this done starting at age 43. Mammogram. This may be done every 1-2 years. Talk to your health care provider about how often you should have regular mammograms. Talk with your health care provider about your test results, treatment options, and if necessary, the need for more tests. Vaccines  Your health care provider may recommend  certain vaccines, such as: Influenza vaccine. This is recommended every year. Tetanus, diphtheria, and acellular pertussis (Tdap, Td) vaccine. You may need a Td booster every 10 years. Zoster vaccine. You may need this after age 28. Pneumococcal 13-valent conjugate (PCV13) vaccine. One dose is recommended after age 88. Pneumococcal polysaccharide (PPSV23) vaccine. One dose is recommended after age 47. Talk to your health care provider about which screenings and vaccines you need and how often you need them. This information is not intended to replace advice given to you by your health care provider. Make sure you discuss any questions you have with your health care provider. Document Released: 01/07/2016 Document Revised: 08/30/2016 Document Reviewed: 10/12/2015 Elsevier Interactive Patient Education  2017 [place] Prevention in the Home Falls can cause injuries. They can happen to people of all ages. There are many things you can do to make your home safe and to help prevent falls. What can I do on the outside of my home? Regularly fix the edges of walkways and driveways and fix any cracks. Remove anything that might make you trip as you walk through a door, such as a raised step or threshold. Trim any bushes or trees on the path to your home. Use bright outdoor lighting. Clear any walking paths of anything that might make someone trip, such as rocks or tools. Regularly check to see if handrails are loose or broken. Make sure that both sides of any steps have handrails. Any raised decks and porches should have guardrails on the edges. Have any leaves, snow, or ice cleared regularly. Use sand or salt on walking paths during winter. Clean up any spills in your garage right away. This includes oil or grease spills. What can I do in the bathroom? Use night lights. Install grab bars by the toilet and in the tub and shower. Do not use towel bars as grab bars. Use non-skid mats or  decals in the tub or shower. If you need to sit down in the shower, use a plastic, non-slip stool. Keep the floor dry. Clean up any water that spills on the floor as soon as it happens. Remove soap buildup in the tub or shower regularly. Attach bath mats securely with double-sided non-slip rug tape. Do not have throw rugs and other things on the floor that can make you trip. What can I do in the bedroom? Use night lights. Make sure that you have a light by your bed that is easy to reach. Do not use any sheets or blankets that are too big for your bed. They should not hang down onto the floor. Have a firm chair that has side arms. You can use this for support while you get dressed. Do not have throw rugs and other things on the floor that can make you trip. What can I do in the kitchen? Clean up any spills right away. Avoid walking on wet floors. Keep items that you use a lot in easy-to-reach places. If you need to reach something above you, use a strong step stool that has a grab bar. Keep electrical  cords out of the way. Do not use floor polish or wax that makes floors slippery. If you must use wax, use non-skid floor wax. Do not have throw rugs and other things on the floor that can make you trip. What can I do with my stairs? Do not leave any items on the stairs. Make sure that there are handrails on both sides of the stairs and use them. Fix handrails that are broken or loose. Make sure that handrails are as long as the stairways. Check any carpeting to make sure that it is firmly attached to the stairs. Fix any carpet that is loose or worn. Avoid having throw rugs at the top or bottom of the stairs. If you do have throw rugs, attach them to the floor with carpet tape. Make sure that you have a light switch at the top of the stairs and the bottom of the stairs. If you do not have them, ask someone to add them for you. What else can I do to help prevent falls? Wear shoes that: Do not  have high heels. Have rubber bottoms. Are comfortable and fit you well. Are closed at the toe. Do not wear sandals. If you use a stepladder: Make sure that it is fully opened. Do not climb a closed stepladder. Make sure that both sides of the stepladder are locked into place. Ask someone to hold it for you, if possible. Clearly mark and make sure that you can see: Any grab bars or handrails. First and last steps. Where the edge of each step is. Use tools that help you move around (mobility aids) if they are needed. These include: Canes. Walkers. Scooters. Crutches. Turn on the lights when you go into a dark area. Replace any light bulbs as soon as they burn out. Set up your furniture so you have a clear path. Avoid moving your furniture around. If any of your floors are uneven, fix them. If there are any pets around you, be aware of where they are. Review your medicines with your doctor. Some medicines can make you feel dizzy. This can increase your chance of falling. Ask your doctor what other things that you can do to help prevent falls. This information is not intended to replace advice given to you by your health care provider. Make sure you discuss any questions you have with your health care provider. Document Released: 10/07/2009 Document Revised: 05/18/2016 Document Reviewed: 01/15/2015 Elsevier Interactive Patient Education  2017 [employer].

## 2023-02-19 DIAGNOSIS — N9089 Other specified noninflammatory disorders of vulva and perineum: Secondary | ICD-10-CM | POA: Diagnosis not present

## 2023-02-21 DIAGNOSIS — Z8673 Personal history of transient ischemic attack (TIA), and cerebral infarction without residual deficits: Secondary | ICD-10-CM | POA: Diagnosis not present

## 2023-02-21 DIAGNOSIS — C4492 Squamous cell carcinoma of skin, unspecified: Secondary | ICD-10-CM | POA: Diagnosis not present

## 2023-02-21 DIAGNOSIS — K219 Gastro-esophageal reflux disease without esophagitis: Secondary | ICD-10-CM | POA: Diagnosis not present

## 2023-02-21 DIAGNOSIS — N9089 Other specified noninflammatory disorders of vulva and perineum: Secondary | ICD-10-CM | POA: Diagnosis not present

## 2023-02-21 DIAGNOSIS — C519 Malignant neoplasm of vulva, unspecified: Secondary | ICD-10-CM | POA: Diagnosis not present

## 2023-03-01 ENCOUNTER — Telehealth: Payer: Self-pay | Admitting: *Deleted

## 2023-03-01 ENCOUNTER — Encounter: Payer: Self-pay | Admitting: Psychiatry

## 2023-03-01 DIAGNOSIS — C519 Malignant neoplasm of vulva, unspecified: Secondary | ICD-10-CM | POA: Diagnosis not present

## 2023-03-01 NOTE — Telephone Encounter (Signed)
Late entry----02/27/2023 at 61 pm-----Spoke with the patient regarding the referral to GYN oncology. Patient scheduled as new patient with Dr Ernestina Patches on 3/11.   Explained to the patient the the doctor will perform a pelvic exam at this visit. Patient given the policy that only one visitor allowed and that visitor must be over 16 yrs are allowed in the [place]. Patient given the address/phone number for the clinic and that the center offers free valet service. Patient aware of the mask mandate.

## 2023-03-05 ENCOUNTER — Inpatient Hospital Stay: Payer: Medicare Other | Attending: Gynecologic Oncology | Admitting: Psychiatry

## 2023-03-05 ENCOUNTER — Telehealth: Payer: Self-pay

## 2023-03-05 ENCOUNTER — Encounter: Payer: Self-pay | Admitting: Psychiatry

## 2023-03-05 ENCOUNTER — Other Ambulatory Visit: Payer: Self-pay

## 2023-03-05 ENCOUNTER — Encounter: Payer: Self-pay | Admitting: Oncology

## 2023-03-05 VITALS — BP 136/72 | HR 78 | Temp 98.5°F | Resp 14 | Wt 166.2 lb

## 2023-03-05 DIAGNOSIS — C519 Malignant neoplasm of vulva, unspecified: Secondary | ICD-10-CM | POA: Insufficient documentation

## 2023-03-05 DIAGNOSIS — K219 Gastro-esophageal reflux disease without esophagitis: Secondary | ICD-10-CM | POA: Diagnosis not present

## 2023-03-05 DIAGNOSIS — Z87891 Personal history of nicotine dependence: Secondary | ICD-10-CM | POA: Insufficient documentation

## 2023-03-05 DIAGNOSIS — Z90710 Acquired absence of both cervix and uterus: Secondary | ICD-10-CM | POA: Diagnosis not present

## 2023-03-05 DIAGNOSIS — E782 Mixed hyperlipidemia: Secondary | ICD-10-CM | POA: Diagnosis not present

## 2023-03-05 DIAGNOSIS — E538 Deficiency of other specified B group vitamins: Secondary | ICD-10-CM | POA: Diagnosis not present

## 2023-03-05 DIAGNOSIS — M8580 Other specified disorders of bone density and structure, unspecified site: Secondary | ICD-10-CM | POA: Insufficient documentation

## 2023-03-05 DIAGNOSIS — Z79899 Other long term (current) drug therapy: Secondary | ICD-10-CM | POA: Diagnosis not present

## 2023-03-05 DIAGNOSIS — Z8673 Personal history of transient ischemic attack (TIA), and cerebral infarction without residual deficits: Secondary | ICD-10-CM | POA: Insufficient documentation

## 2023-03-05 DIAGNOSIS — M1990 Unspecified osteoarthritis, unspecified site: Secondary | ICD-10-CM | POA: Diagnosis not present

## 2023-03-05 NOTE — Patient Instructions (Signed)
Plan on having a PET scan to evaluate for spread of the vulvar cancer. You will be contacted with the results. Nothing to eat or drink except water before the PET scan.  Plan for surgery at [facility] on 4/4 or 4/11 with the procedure being a radical vulvectomy with bilateral inguinal sentinel lymph node evaluation. You will receive a phone call from [facility] with further details and to arrange for a preop appointment.  Plan on holding your plavix for 5 days before surgery.

## 2023-03-05 NOTE — Progress Notes (Signed)
GYNECOLOGIC ONCOLOGY NEW PATIENT CONSULTATION  Date of Service: 03/05/2023 Referring Provider: Gari Crown, MD [address] [place],  [place] [address]   ASSESSMENT AND PLAN: Debra Olsen is a 87 y.o. 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  We reviewed the nature of vulvar cancer and the recommend treatment of surgical staging.  Surgery would include a radical vulvectomy with bilateral inguinal lymph node evaluation.  Following surgery, some patients may require adjuvant treatment with radiation depending on final pathology.  We reviewed that next steps would include getting a PET/CT.  Her bilateral groins were negative for clinical lymphadenopathy on exam today.  Based on size of lesion she would be a candidate for sentinel lymph node biopsy.  Given that the lesion involves the midline structure of the clitoris, would recommend bilateral groin evaluation.  Reviewed with patient that if grossly abnormal lymph nodes on PET/CT, would consider lymph node dissection as opposed to sentinel lymph node biopsy.  Reviewed process of sentinel lymph node biopsy to involve a nuclear medicine lymphoscintigram as well as injection intraoperatively with ICG dye.  Given this, unable to perform this procedure at [facility] health for coordination with nuclear medicine.  Would recommend procedure at [facility] if pursuing sentinel lymph node approach pending PET/CT results.  Patient was consented for: Pelvic exam under anesthesia, radical partial vulvectomy, bilateral inguinofemoral sentinel lymph node biopsy, possible inguinofemoral lymph node dissection on 04/05/23 at [facility].  We reviewed that her clitoris would be removed in the process of this resection.  Patient is not currently sexually active and has not been for many years.  The risks of surgery were discussed in detail and she understands these to including but not limited to bleeding requiring a blood transfusion,  infection, injury to adjacent organs (including but not limited to the urethra, vagina, nearby vessels and nerves), thromboembolic events, wound separation, possible risk of lymphedema and lymphocyst if lymphadenectomy performed, unforseen complication, possible need for re-exploration, and medical complications such as heart attack, stroke, pneumonia.  If the patient experiences any of these events, she understands that her hospitalization or recovery may be prolonged and that she may need to take additional medications for a prolonged period. The patient will receive DVT and antibiotic prophylaxis as indicated. She voiced a clear understanding. She had the opportunity to ask questions and informed consent was obtained today. She wishes to proceed.  Patient instructed to hold Plavix for 5 days prior to surgery.  We will confirm this with her PCP. Will send Precare referral to anesthesia at [facility]. All preoperative instructions were reviewed. Postoperative expectations were also reviewed.   A copy of this note was sent to the patient's referring provider.  Bernadene Bell, MD Gynecologic Oncology   Medical Decision Making I personally spent  TOTAL 60 minutes face-to-face and non-face-to-face in the care of this patient, which includes all pre, intra, and post visit time on the date of service.   ------------  CC: Vulvar cancer  HISTORY OF PRESENT ILLNESS:  Debra Olsen is a 87 y.o. woman who is seen in consultation at the request of Gari Crown, MD for evaluation of a new diagnosis of vulvar cancer.  Patient was eval by her OB/GYN for a lesion on her right labia.  Patient reports she noted a lesion to the upper right labia for about 2 years but has increased in size and bleeds easily while she is on Plavix for an undocumented possible CVA.  On  02/21/2023 she underwent biopsies of the broad-based lesion of the upper right vulva and periclitoral region.  Pathology returned with invasive  squamous cell carcinoma in both biopsy specimens with a depth of invasion of 4 mm.  Positive margins were noted from both biopsies.  Patient presents today with her oldest daughter.  She reports that she has been off of the Plavix since her biopsy and has not resumed.  She believes that this is followed/prescribed by her PCP with [facility].  She otherwise reports that she has not had any ongoing bleeding since the procedure.  She had itching previously but not now.  She otherwise denies abdominal bloating, early satiety, significant weight loss, change in bowel or bladder habits.    PAST MEDICAL HISTORY: Past Medical History:  Diagnosis Date   Arthritis    Diverticulosis of intestine    GERD (gastroesophageal reflux disease)    Mixed hyperlipidemia    Osteopenia 04/05/2021   Prediabetes    TIA (transient ischemic attack)    Vitamin B12 deficiency     PAST SURGICAL HISTORY: Past Surgical History:  Procedure Laterality Date   ABDOMINAL HYSTERECTOMY      OB/GYN HISTORY: OB History  Gravida Para Term Preterm AB Living  '5 5 5     5  '$ SAB IAB Ectopic Multiple Live Births          5    # Outcome Date GA Lbr Len/2nd Weight Sex Delivery Anes PTL Lv  5 Term      Vag-Spont   LIV  4 Term      Vag-Spont   LIV  3 Term      Vag-Spont   LIV  2 Term      Vag-Spont   LIV  1 Term      Vag-Spont   LIV      Age at menarche: 42 Age at menopause: Unsure, status post hysterectomy Hx of HRT: No Hx of STI: None Last pap: Unsure History of abnormal pap smears: denies  SCREENING STUDIES:  Last mammogram: unsure if she has had one Last colonoscopy: >20 years ago  MEDICATIONS:  Current Outpatient Medications:    acetaminophen (TYLENOL) 500 MG tablet, Take 1,000 mg by mouth every 6 (six) hours as needed for mild pain., Disp: , Rfl:    busPIRone (BUSPAR) 7.5 MG tablet, Take 1 tablet (7.5 mg total) by mouth 2 (two) times daily as needed., Disp: 60 tablet, Rfl: 2   clopidogrel  (PLAVIX) 75 MG tablet, TAKE 1 TABLET BY MOUTH EVERY DAY, Disp: 90 tablet, Rfl: 0   Garlic [id number] MG TBEC, Take by mouth., Disp: , Rfl:    latanoprost (XALATAN) 0.005 % ophthalmic solution, 1 drop at bedtime., Disp: , Rfl:    MAGNESIUM PO, Take 1 tablet by mouth daily., Disp: , Rfl:    Multiple Vitamin (MULTIVITAMIN) tablet, Take 1 tablet by mouth daily., Disp: , Rfl:    omeprazole (PRILOSEC) 10 MG capsule, Take 10 mg by mouth daily., Disp: , Rfl:    pravastatin (PRAVACHOL) 40 MG tablet, Take 1 tablet (40 mg total) by mouth once a week., Disp: 12 tablet, Rfl: 1  ALLERGIES: Allergies  Allergen Reactions   Lidocaine Other (See Comments)    Passed out   Penicillins Rash    FAMILY HISTORY: Family History  Problem Relation Age of Onset   Kidney disease Mother    Heart failure Father    Heart disease Father    Breast cancer Sister  Alzheimer's disease Sister    Diabetes Brother    Cancer Brother        unknown   Heart disease Brother    Cancer Other    Ovarian cancer Neg Hx    Colon cancer Neg Hx    Uterine cancer Neg Hx     SOCIAL HISTORY: Social History   Socioeconomic History   Marital status: Married    Spouse name: Not on file   Number of children: 5   Years of education: Not on file   Highest education level: Not on file  Occupational History   Occupation: retired  Tobacco Use   Smoking status: Former    Types: Cigarettes   Smokeless tobacco: Never  Scientific laboratory technician Use: Never used  Substance and Sexual Activity   Alcohol use: Yes    Comment: occ   Drug use: No   Sexual activity: Not Currently  Other Topics Concern   Not on file  Social History Narrative   Visits with daughter, Otila Kluver a lot   Other children live out of state   Daughter in [place] is a Tax adviser and their POA   Social Determinants of Health   Financial Resource Strain: [place]  (02/12/2023)   Overall Financial Resource Strain (CARDIA)    Difficulty of Paying Living Expenses: Not hard at  all  Food Insecurity: No [place] (03/01/2023)   Hunger Vital Sign    Worried About Running Out of Food in the Last Year: Never true    [place] in the Last Year: Never true  Transportation Needs: No Transportation Needs (03/01/2023)   PRAPARE - Hydrologist (Medical): No    Lack of Transportation (Non-Medical): No  Physical Activity: Insufficiently Active (02/12/2023)   Exercise Vital Sign    Days of Exercise per Week: 3 days    Minutes of Exercise per Session: 30 min  Stress: No Stress Concern Present (02/12/2023)   [employer]    Feeling of Stress : Not at all  Social Connections: Socially Isolated (02/12/2023)   Social Connection and Isolation Panel [NHANES]    Frequency of Communication with Friends and Family: More than three times a week    Frequency of Social Gatherings with Friends and Family: More than three times a week    Attends Religious Services: Never    Marine scientist or Organizations: No    Attends Archivist Meetings: Never    Marital Status: Widowed  Intimate Partner Violence: Not At Risk (03/01/2023)   Humiliation, Afraid, Rape, and Kick questionnaire    Fear of Current or Ex-Partner: No    Emotionally Abused: No    Physically Abused: No    Sexually Abused: No    REVIEW OF SYSTEMS: New patient intake form was reviewed.  Complete 10-system review is negative except for the following: Constipation, urinary frequency, joint pain, easy bruising/bleeding, decreased concentration, pelvic pain, numbness, anxiety, fatigue, incontinence, muscle pain/cramps, depression, itching  PHYSICAL EXAM: BP 136/72 (BP Location: Left Arm, Patient Position: Sitting)   Pulse 78   Temp 98.5 F (36.9 C) (Oral)   Resp 14   Wt 166 lb 3.2 oz (75.4 kg)   SpO2 99%   BMI 25.27 kg/m  Constitutional: No acute distress. Neuro/Psych: Alert, oriented.  Head and Neck:  Normocephalic, atraumatic. Neck symmetric without masses. Sclera anicteric.  Respiratory: Normal work of breathing. Clear to auscultation  bilaterally. Cardiovascular: Regular rate and rhythm, no murmurs, rubs, or gallops. Abdomen: Normoactive bowel sounds. Soft, non-distended, non-tender to palpation. No masses or hepatosplenomegaly appreciated. No evidence of hernia. No palpable fluid wave.  Well-healed infraumbilical midline incision. Extremities: Grossly normal range of motion. Warm, well perfused. No edema bilaterally. Skin: No rashes or lesions. Lymphatic: No cervical, supraclavicular, or inguinal adenopathy. Genitourinary: External genitalia with an approximate 3 x 1.5 cm lesion of the anterior right labia minora extending to the clitoris.  Stitch in place from prior biopsies, overall healing well.  Lesion is mobile.  Otherwise urethral meatus without lesions or prolapse. On speculum exam, vagina without lesions. Bimanual exam reveals smooth vaginal cuff.  Cervix and uterus surgically absent.  No pelvic mass palpated. Exam chaperoned by Joylene John, NP  LABORATORY AND RADIOLOGIC DATA: Outside medical records were reviewed to synthesize the above history, along with the history and physical obtained during the visit.  Outside laboratory, pathology reports were reviewed, with pertinent results below.    WBC  Date Value Ref Range Status  07/26/2022 6.7 3.4 - 10.8 x10E3/uL Final  02/29/2020 7.6 4.0 - 10.5 K/uL Final   Hemoglobin  Date Value Ref Range Status  07/26/2022 14.1 11.1 - 15.9 g/dL Final   Hematocrit  Date Value Ref Range Status  07/26/2022 42.7 34.0 - 46.6 % Final   Platelets  Date Value Ref Range Status  07/26/2022 302 150 - 450 x10E3/uL Final   Magnesium  Date Value Ref Range Status  08/25/2020 2.3 1.6 - 2.3 mg/dL Final   Creatinine, Ser  Date Value Ref Range Status  10/24/2022 0.88 0.57 - 1.00 mg/dL Final   AST  Date Value Ref Range Status  10/24/2022 18 0 - 40  IU/L Final   ALT  Date Value Ref Range Status  10/24/2022 11 0 - 32 IU/L Final    Surgical pathology 02/21/23: Specimen 1: Labium, biopsy, right sided labial lesion   - INVASIVE KERATINIZING SQUAMOUS CELL CARCINOMA, 1.0 CM IN MAXIMUM EXTENT WITH DEPTH OF INVASION 0.4 CM.   - INKED RESECTION MARGIN POSITIVE.   Specimen 2: Labium, biopsy, superior right labial lesion   -  KERATINIZING INVASIVE SQUAMOUS CELL CARCINOMA, 0.4 CM IN MAXIMUM EXTENT, WITH 0.5 CM IN MAXIMUM THICKNESS.   -  INKED MARGIN POSITIVE.

## 2023-03-05 NOTE — Progress Notes (Signed)
Met with Guida and her daughter during her new patient appointment with Dr. Ernestina Patches.  Went over her AVS and advised her of her PET scan appointment and instructions on 03/08/23.  Also provided my contact information and encouraged them to call with any questions.

## 2023-03-05 NOTE — Telephone Encounter (Signed)
Pt called office stating she was seen today by Dr. Ernestina Patches. She would prefer her surgery be on 4/11, stating someone is available that day to watch her dog.  Dr. Ernestina Patches and Joylene John NP notified.

## 2023-03-06 ENCOUNTER — Telehealth: Payer: Self-pay | Admitting: *Deleted

## 2023-03-06 NOTE — Telephone Encounter (Signed)
Per Dr Ernestina Patches patient scheduled for a post op appt on 4/29 at 1:45 pm. Patient's daughter aware

## 2023-03-08 ENCOUNTER — Encounter (HOSPITAL_COMMUNITY)
Admission: RE | Admit: 2023-03-08 | Discharge: 2023-03-08 | Disposition: A | Discharge status: Home / Self Care | Payer: Medicare Other | Source: Ambulatory Visit | Attending: Psychiatry | Admitting: Psychiatry

## 2023-03-08 ENCOUNTER — Encounter (HOSPITAL_COMMUNITY): Payer: Self-pay

## 2023-03-08 DIAGNOSIS — C519 Malignant neoplasm of vulva, unspecified: Secondary | ICD-10-CM | POA: Insufficient documentation

## 2023-03-08 HISTORY — DX: Malignant (primary) neoplasm, unspecified: C80.1

## 2023-03-08 MED ORDER — FLUDEOXYGLUCOSE F - 18 (FDG) INJECTION
9.6000 | Freq: Once | INTRAVENOUS | Status: AC | PRN
  Administered 2023-03-08: 8.55 via INTRAVENOUS

## 2023-03-12 ENCOUNTER — Telehealth: Payer: Self-pay

## 2023-03-12 ENCOUNTER — Inpatient Hospital Stay: Payer: Medicare Other | Admitting: Licensed Clinical Social Worker

## 2023-03-12 NOTE — Telephone Encounter (Signed)
Pt is aware of message from [place] regarding PET scan results.

## 2023-03-12 NOTE — Progress Notes (Signed)
[facility]  Initial Assessment   Debra Olsen is a 87 y.o. year old female contacted by phone. Clinical Social Work was referred by new patient protocol for assessment of psychosocial needs.   SDOH (Social Determinants of Health) assessments performed: Yes SDOH Interventions    Flowsheet Row Clinical Support from 02/12/2023 in [facility] Office Visit from 07/26/2022 in [facility] Office Visit from 01/26/2022 in [facility] Clinical Support from 01/19/2022 in [facility] Office Visit from 07/26/2021 in [facility]  SDOH Interventions       Food Insecurity Interventions Intervention Not Indicated -- -- Intervention Not Indicated --  Housing Interventions Intervention Not Indicated -- -- Intervention Not Indicated --  Transportation Interventions Intervention Not Indicated -- -- Intervention Not Indicated --  Utilities Interventions Intervention Not Indicated -- -- -- --  Alcohol Usage Interventions Intervention Not Indicated (Score <7) -- -- -- --  Depression Interventions/Treatment  -- PHQ2-9 Score <4 Follow-up Not Indicated PHQ2-9 Score <4 Follow-up Not Indicated PHQ2-9 Score <4 Follow-up Not Indicated Currently on Treatment  Financial Strain Interventions Intervention Not Indicated -- -- Intervention Not Indicated --  Physical Activity Interventions Intervention Not Indicated -- -- Intervention Not Indicated --  Stress Interventions Intervention Not Indicated -- -- Intervention Not Indicated --  Social Connections Interventions Intervention Not Indicated -- -- Intervention Not Indicated --       SDOH Screenings   Food Insecurity: No Food Insecurity (03/01/2023)  Housing: Low Risk  (03/01/2023)  Transportation Needs: No Transportation Needs (03/01/2023)  Utilities: Not At Risk (03/01/2023)  Alcohol Screen: Low  Risk  (02/12/2023)  Depression (PHQ2-9): Low Risk  (03/01/2023)  Financial Resource Strain: Low Risk  (02/12/2023)  Physical Activity: Insufficiently Active (02/12/2023)  Social Connections: Socially Isolated (02/12/2023)  Stress: No Stress Concern Present (02/12/2023)  Tobacco Use: Medium Risk (03/08/2023)     Distress Screen completed: No     No data to display            Family/Social Information:  Housing Arrangement: patient lives alone. One daughter lives nearby, other daughter will be with her during & post surgery for about 1 week Family members/support persons in your life? Family Transportation concerns: no  Employment: Retired.  Income source: Paediatric nurse concerns: No Type of concern: None Food access concerns: no Religious or spiritual practice: Not known Services Currently in place:    Coping/ Adjustment to diagnosis: Patient understands treatment plan and what happens next? yes, has surgery coming up next month. Pt asked about staying night before surgery if possible Concerns about diagnosis and/or treatment:  navigating new hospital Current coping skills/ strengths: Capable of independent living  and Supportive family/friends     SUMMARY: Current SDOH Barriers:  No major barriers today  Clinical Social Work Clinical Goal(s):  No clinical social work goals at this time  Interventions: Discussed common feeling and emotions when being diagnosed with cancer, and the importance of support during treatment Informed patient of the support team roles and support services at [facility] Provided [place] contact information and encouraged patient to call with any questions or concerns Provided patient with information about [facility] lodging program for pre-surgery hotel if needed   Follow Up Plan: Patient will contact CSW with any support or resource needs Patient verbalizes understanding of plan: Yes    Simon Llamas E Johnetta Sloniker, LCSW

## 2023-03-12 NOTE — Telephone Encounter (Signed)
-----   Message from Bernadene Bell, MD sent at 03/12/2023  2:36 PM EDT ----- Can this pt please be called to let her know that her PET overall looks good in terms of no distant evidence of disease, so we can proceed with surgery as planned at [facility].  Thanks, Ailene Ravel  ----- Message ----- From: Buel Ream, Rad Results In Sent: 03/12/2023  11:38 AM EDT To: Bernadene Bell, MD

## 2023-03-16 ENCOUNTER — Ambulatory Visit: Payer: Medicare Other | Admitting: Gynecologic Oncology

## 2023-03-19 DIAGNOSIS — H401121 Primary open-angle glaucoma, left eye, mild stage: Secondary | ICD-10-CM | POA: Diagnosis not present

## 2023-03-22 ENCOUNTER — Telehealth: Payer: Self-pay | Admitting: Surgery

## 2023-03-22 NOTE — Telephone Encounter (Signed)
Patient called in requesting to speak with Dr Ernestina Patches regarding whether or not surgery is truly necessary, since she is finally recovering from her previous surgery and is feeling a lot better. She also states that if surgery is needed she will need to find a place to stay the night before surgery so her and her daughter doesn't need to drive from [facility] to [facility] in the dark early morning. Patient advised that Dr Ernestina Patches would be notified of her request to speak to her. Information given for [facility] house at [facility] [phone] so patient can call and check if they can accommodate her.

## 2023-03-23 ENCOUNTER — Telehealth: Payer: Self-pay | Admitting: Psychiatry

## 2023-03-23 NOTE — Telephone Encounter (Signed)
Called pt regarding surgery. Advised that my recommendation would be to proceed with surgery. Vulvar cancer when localized can be curable. Once it spreads it can be much more difficult to treat. Will have my nurse at [facility] call her next week to discuss [place] and any other potential options for staying locally the night before surgery.

## 2023-04-05 DIAGNOSIS — K219 Gastro-esophageal reflux disease without esophagitis: Secondary | ICD-10-CM | POA: Diagnosis not present

## 2023-04-05 DIAGNOSIS — C519 Malignant neoplasm of vulva, unspecified: Secondary | ICD-10-CM | POA: Diagnosis not present

## 2023-04-05 DIAGNOSIS — Z880 Allergy status to penicillin: Secondary | ICD-10-CM | POA: Diagnosis not present

## 2023-04-05 DIAGNOSIS — Z8673 Personal history of transient ischemic attack (TIA), and cerebral infarction without residual deficits: Secondary | ICD-10-CM | POA: Diagnosis not present

## 2023-04-09 ENCOUNTER — Telehealth: Payer: Self-pay | Admitting: *Deleted

## 2023-04-09 ENCOUNTER — Encounter: Payer: Self-pay | Admitting: *Deleted

## 2023-04-09 NOTE — Patient Outreach (Signed)
  Care Coordination   Follow Up Visit Note   04/09/2023 Name: Aydah Zill MRN: [id number] DOB: 1936/08/06  Charlynn Beland is a 87 y.o. year old female who sees Daryll Drown, NP (Inactive) for primary care. I spoke with  Emelia Loron by phone today.  What matters to the patients health and wellness today?  Finding location of hospital follow-up appt with gyn oncologist    SDOH assessments and interventions completed:  No    Care Coordination Interventions:  Yes, provided  Interventions Today    Flowsheet Row Most Recent Value  Chronic Disease   Chronic disease during today's visit Other  [hospitalization for vulvar cancer]      TOC Interventions Today    Flowsheet Row Most Recent Value  TOC Interventions   TOC Interventions Discussed/Reviewed Contacted provider for patient needs  [Received appt information & provided to patient: Dr Alvester Morin 4/29 at 1:45 pm. Patient needs to arrive at 1:15 pm. The address is [address] [place], [place]   [address]. Phone number is [phone].]      Follow up plan: No further intervention required.   Encounter Outcome:  Pt. Visit Completed   Demetrios Loll, BSN, RN-BC RN Care Coordinator [facility]  [facility] Direct Dial: [phone] Main #: [phone]

## 2023-04-09 NOTE — Transitions of Care (Post Inpatient/ED Visit) (Signed)
   04/09/2023  Name: Debra Olsen MRN: [id number] DOB: 29-Dec-1935  Today's TOC FU Call Status: Today's TOC FU Call Status:: Successful TOC FU Call Competed TOC FU Call Complete Date: 04/09/23  Transition Care Management Follow-up Telephone Call Date of Discharge: 04/06/23 Discharge Facility: Other ([facility] Facility) Name of Other ([facility]) [facility]charge Facility: [facility] Type of Discharge: Inpatient Admission Primary Inpatient Discharge Diagnosis:: Vulvar cancer How have you been since you were released from the hospital?: Better Any questions or concerns?: No  Items Reviewed: Did you receive and understand the discharge instructions provided?: Yes Medications obtained and verified?: Yes (Medications Reviewed) Any new allergies since your discharge?: No Dietary orders reviewed?: NA Do you have support at home?: Yes People in Home: child(ren), adult Name of Support/Comfort Primary Source: Wynona Canes (daughter)  Home Care and Equipment/Supplies: Were Home Health Services Ordered?: No Any new equipment or medical supplies ordered?: No  Functional Questionnaire: Do you need assistance with bathing/showering or dressing?: No Do you need assistance with meal preparation?: No Do you need assistance with eating?: No Do you have difficulty maintaining continence: No Do you need assistance with getting out of bed/getting out of a chair/moving?: No Do you have difficulty managing or taking your medications?: No  Follow up appointments reviewed: PCP Follow-up appointment confirmed?: NA Specialist Hospital Follow-up appointment confirmed?: Yes Date of Specialist follow-up appointment?: 04/23/23 Follow-Up Specialty Provider:: Dr Alvester Morin (GYN Oncology [facility]) Do you need transportation to your follow-up appointment?: No Do you understand care options if your condition(s) worsen?: Yes-patient verbalized understanding  SDOH Interventions Today    Flowsheet Row  Most Recent Value  SDOH Interventions   Transportation Interventions Patient Resources (Friends/Family)      Interventions Today    Flowsheet Row Most Recent Value  Chronic Disease   Chronic disease during today's visit Other  [recent hospitalization for surgery for vulvar cancer]      TOC Interventions Today    Flowsheet Row Most Recent Value  TOC Interventions   TOC Interventions Discussed/Reviewed TOC Interventions Discussed, TOC Interventions Reviewed, Post op wound/incision care, Post discharge activity limitations per provider, S/S of infection, Contacted provider for patient needs  [reached out to oncology scheduler via staff message to clarify location of follow-up appt with Dr Alvester Morin at the [facility]]      Demetrios Loll, BSN, RN-BC RN Care Coordinator [facility]  [facility] Direct Dial: [phone] Main #: [phone]

## 2023-04-23 ENCOUNTER — Other Ambulatory Visit: Payer: Self-pay

## 2023-04-23 ENCOUNTER — Inpatient Hospital Stay: Payer: Medicare Other | Attending: Gynecologic Oncology | Admitting: Psychiatry

## 2023-04-23 VITALS — BP 156/74 | HR 87 | Temp 98.6°F | Wt 176.4 lb

## 2023-04-23 DIAGNOSIS — C519 Malignant neoplasm of vulva, unspecified: Secondary | ICD-10-CM

## 2023-04-23 DIAGNOSIS — Z7189 Other specified counseling: Secondary | ICD-10-CM | POA: Diagnosis not present

## 2023-04-23 DIAGNOSIS — Z9079 Acquired absence of other genital organ(s): Secondary | ICD-10-CM | POA: Insufficient documentation

## 2023-04-23 NOTE — Patient Instructions (Signed)
It was a pleasure to see you in clinic today. - You are healing well overall - Let us know if the swelling in the groins are getting bigger - Return visit planned for 1 month - Okay to gradually reintroduce normal activities  Thank you very much for allowing me to provide care for you today.  I appreciate your confidence in choosing our Gynecologic Oncology team at [facility].  If you have any questions about your visit today please call our office or send [place] a MyChart message and we will get back to you as soon as possible.

## 2023-04-23 NOTE — Progress Notes (Unsigned)
Gynecologic Oncology Return Clinic Visit  Date of Service: 04/23/2023 Referring Provider: Ernestina Penna, MD [address] [place],  [place] [address]  Assessment & Plan: Debra Olsen is a 87 y.o. 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  Postop: - Pt recovering well from surgery and healing appropriately postoperatively - Intraoperative findings and pathology results reviewed. - Ongoing postoperative expectations and precautions reviewed. Continue with no lifting >10lbs through 6 weeks postoperatively - Pt works ***. Okay to return to work at [employer] - Given that uterus is in situ, pt advised that she should continue with pap smear screening per routine until age 61 if she continues with negative/low grade paps.  - Reviewed that after 12 months without menstrual cycles, she should not have any spotting or bleeding.  If this were to occur, she should be evaluated for postmenopausal bleeding.  *** Maybe q3 mo initially, then 6 yr 2 then annually PET in 6 mo Follow-up in 1 month given groins   ***VTE Prophylaxis: - Khorana score = ***  RTC ***.  Clide Cliff, MD Gynecologic Oncology   Medical Decision Making I personally spent  TOTAL *** minutes face-to-face and non-face-to-face in the care of this patient, which includes all pre, intra, and post visit time on the date of service.  *** minutes spent reviewing records prior to the visit *** Minutes in patient contact      *** minutes in other billable services *** minutes charting , conferring with consultants etc.   ----------------------- Reason for Visit: Postop***  Treatment History: Oncology History   No history exists.    Interval History: Pt reports that she is recovering well from surgery. She is not having any pain. She is eating and  drinking well. She is voiding without issue and having regular bowel movements. Denies any bleeding. Reports recently noticing a lump in her left groin that is not painful.   Past Medical/Surgical History: Past Medical History:  Diagnosis Date   Arthritis    Cancer (HCC)    Vulvar   Diverticulosis of intestine    GERD (gastroesophageal reflux disease)    Mixed hyperlipidemia    Osteopenia 04/05/2021   Prediabetes    TIA (transient ischemic attack)    Vitamin B12 deficiency     Past Surgical History:  Procedure Laterality Date   ABDOMINAL HYSTERECTOMY      Family History  Problem Relation Age of Onset   Kidney disease Mother    Heart failure Father    Heart disease Father    Breast cancer Sister    Alzheimer's disease Sister    Diabetes Brother    Cancer Brother        unknown   Heart disease Brother    Cancer Other    Ovarian cancer Neg Hx    Colon cancer Neg Hx    Uterine cancer Neg Hx     Social History   Socioeconomic History   Marital status: Married    Spouse name: Not on file   Number of children: 5   Years of education: Not on file   Highest education level: Not on file  Occupational History   Occupation: retired  Tobacco Use   Smoking status: Former    Types: Cigarettes   Smokeless tobacco: Never  Vaping Use   Vaping Use: Never used  Substance and Sexual Activity   Alcohol use: Yes    Comment: occ   Drug use: No   Sexual activity: Not Currently  Other Topics Concern   Not on file  Social History Narrative   Visits with daughter, Inetta Fermo a lot   Other children live out of state   Daughter in [place] is a doctor and their POA   Social Determinants of Health   Financial Resource Strain: Low Risk  (02/12/2023)   Overall Financial Resource Strain (CARDIA)    Difficulty of Paying Living Expenses: Not hard at all  Food Insecurity: No Food Insecurity (03/01/2023)   Hunger Vital Sign    Worried About Running Out of Food in the Last Year: Never true     Ran Out of Food in the Last Year: Never true  Transportation Needs: No Transportation Needs (04/09/2023)   PRAPARE - Administrator, Civil Service (Medical): No    Lack of Transportation (Non-Medical): No  Physical Activity: Insufficiently Active (02/12/2023)   Exercise Vital Sign    Days of Exercise per Week: 3 days    Minutes of Exercise per Session: 30 min  Stress: No Stress Concern Present (02/12/2023)   [employer] of Occupational Health - Occupational Stress Questionnaire    Feeling of Stress : Not at all  Social Connections: Socially Isolated (02/12/2023)   Social Connection and Isolation Panel [NHANES]    Frequency of Communication with Friends and Family: More than three times a week    Frequency of Social Gatherings with Friends and Family: More than three times a week    Attends Religious Services: Never    Database administrator or Organizations: No    Attends Banker Meetings: Never    Marital Status: Widowed    Current Medications:  Current Outpatient Medications:    acetaminophen (TYLENOL) 500 MG tablet, Take 1,000 mg by mouth every 6 (six) hours as needed for mild pain., Disp: , Rfl:    busPIRone (BUSPAR) 7.5 MG tablet, Take 1 tablet (7.5 mg total) by mouth 2 (two) times daily as needed., Disp: 60 tablet, Rfl: 2   Garlic 400 MG TBEC, Take by mouth., Disp: , Rfl:    latanoprost (XALATAN) 0.005 % ophthalmic solution, 1 drop at bedtime., Disp: , Rfl:    MAGNESIUM PO, Take 1 tablet by mouth daily., Disp: , Rfl:    Multiple Vitamin (MULTIVITAMIN) tablet, Take 1 tablet by mouth daily., Disp: , Rfl:    omeprazole (PRILOSEC) 10 MG capsule, Take 10 mg by mouth daily., Disp: , Rfl:    pravastatin (PRAVACHOL) 40 MG tablet, Take 1 tablet (40 mg total) by mouth once a week., Disp: 12 tablet, Rfl: 1   timolol (TIMOPTIC) 0.5 % ophthalmic solution, 1 drop every morning., Disp: , Rfl:   Review of Symptoms: Complete 10-system review is positive for:  constipation (improved with medications), urinary frequency, easy bruising, anxiety/depression, occasional dizziness, fatigue  Physical Exam: BP (!) 156/74 (BP Location: Left Arm, Patient Position: Sitting)   Pulse 87   Temp 98.6 F (37 C) (Oral)   Wt 176 lb 6.4 oz (80 kg)   SpO2 96%   BMI 26.82 kg/m  General: Alert, oriented, no acute distress. HEENT: Normocephalic, atraumatic. Neck symmetric without masses. Sclera anicteric.  Chest: Normal work of breathing. Clear to auscultation bilaterally.   Cardiovascular: Regular rate and rhythm, no murmurs. Abdomen: Soft, nontender.  Normoactive bowel sounds.  Lymph nodes: bilateral groin incisions healing well. Suture protruding  from skin trimmed on each side. Approximately 3cm round firm swelling in each groin. No erythema or drainage concerning for infection. No fluctuance Extremities: Grossly normal range of motion.  Warm, well perfused.  No edema bilaterally. Skin: No rashes or lesions noted. GU: External genitalia with evidence of recent anterior vulvectomy, healing well without any separate. Some suture still visible. Exam chaperoned by Kimberly [place], CMA   Laboratory & Radiologic Studies: Diagnosis   A. Lymph node, sentinel, right inguinal, excision - One lymph node with no metastatic carcinoma identified by H&E or pancytokeratin AE1/AE3 immunohistochemical stain (0/1)   B. Lymph node, sentinel, left inguinal, excision - One lymph node with no metastatic carcinoma identified by H&E or pancytokeratin AE1/AE3 immunohistochemical stain (0/1)   C. 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  Electronically signed by Michaelle Birks, MD on 04/10/2023 at  9:57 AM  Diagnosis Comment   Immunohistochemical stains are performed on C5 and show that the tumor is negative for p16 with apparent null pattern negative staining for p53, consistent with HPV independent squamous cell carcinoma. CD31 stain on block C8 shows apparent endothelial cells around an area of tumor, supporting the presence of focal lymphovascular space invasion.  Synoptic Report VULVA VULVA - All Specimens 9th Edition - Protocol posted: 12/06/2022  SPECIMEN    Procedure:    Partial vulvectomy  TUMOR    Tumor Focality:    Unifocal    Tumor Site:    Anterior midline vulva    Tumor Size:    Greatest Dimension (Centimeters): 0.6 cm    Histologic Type:    Squamous cell carcinoma, HPV-independent    Histologic Grade:    G3, poorly differentiated    Depth of Invasion (Millimeters) (FIGO 2021 method):    2 mm    Tumor Growth Pattern:    Partially pushing but focally infiltrative    Other Tissue / Organ Involvement:    Not applicable    Lymphatic and / or Vascular Invasion:    Present  MARGINS    Margin Status for Invasive Carcinoma:    All margins negative for invasive carcinoma      Closest Margin(s) to Invasive Carcinoma:    Peripheral: 3-6:00      Distance from Invasive Carcinoma to Closest Margin:    3 mm    Margin Status for Precursor Lesions of Squamous Cell Carcinoma and / or Paget Disease:    All margins negative for squamous precursor lesions  REGIONAL LYMPH NODES    Regional Lymph Node Status:          :    All regional lymph nodes negative for tumor cells      Total Number of Lymph Nodes Examined:    2        Nodal Site(s) Examined:    Right inguinal: sentinel        Nodal  Site(s) Examined:    Left inguinal: sentinel      Number of Sentinel Nodes Examined:    2  pTNM  CLASSIFICATION (AJCC Version 9)    Reporting of pT, pN, and (when applicable) pM categories is based on information available to the pathologist at the time the report is issued. As per the AJCC (Chapter 1, 8th Ed.) it is the managing physician's responsibility to establish the final pathologic stage based upon all pertinent information, including but potentially not limited to this pathology report.    pT Category:    pT1b    pN Category:    pN0    N Suffix:    (sn)  FIGO STAGE    FIGO Stage:    IB  ADDITIONAL FINDINGS    Additional Findings:    Differentiated vulvar intraepithelial neoplasia (dVIN)  SPECIAL STUDIES    p16 Immunohistochemistry:    Negative    p53 Immunohistochemistry:    Abnormal      :    Absent / null     Comment(s):    See diagnosis comment

## 2023-04-24 ENCOUNTER — Ambulatory Visit: Payer: Medicare Other | Admitting: Family Medicine

## 2023-04-26 ENCOUNTER — Encounter: Payer: Self-pay | Admitting: Psychiatry

## 2023-04-26 DIAGNOSIS — C519 Malignant neoplasm of vulva, unspecified: Secondary | ICD-10-CM | POA: Insufficient documentation

## 2023-05-22 DIAGNOSIS — H401221 Low-tension glaucoma, left eye, mild stage: Secondary | ICD-10-CM | POA: Diagnosis not present

## 2023-05-23 ENCOUNTER — Other Ambulatory Visit: Payer: Self-pay

## 2023-05-23 ENCOUNTER — Encounter: Payer: Self-pay | Admitting: Psychiatry

## 2023-05-23 ENCOUNTER — Inpatient Hospital Stay: Payer: Medicare Other | Attending: Gynecologic Oncology | Admitting: Psychiatry

## 2023-05-23 VITALS — BP 131/66 | HR 59 | Temp 98.2°F | Resp 16 | Wt 173.0 lb

## 2023-05-23 DIAGNOSIS — Z9079 Acquired absence of other genital organ(s): Secondary | ICD-10-CM

## 2023-05-23 DIAGNOSIS — C519 Malignant neoplasm of vulva, unspecified: Secondary | ICD-10-CM

## 2023-05-23 NOTE — Progress Notes (Signed)
Gynecologic Oncology Return Clinic Visit  Date of Service: 05/23/2023 Referring Provider: Ernestina Penna, MD [address] [place],  [place] [address]  Assessment & Plan: Debra Olsen is a 87 y.o. 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  Postop: - Pt recovering well from surgery and healing appropriately postoperatively. -Groin incisions now well-healed without ongoing swelling. - Restrictions lifted.  SCC of the vulva: - Given risk factors that increase her risk for recurrence including positive LVSI, grade 3, and close margin, plan 67mo PET, scheduled for 10/11/23. - Surveillance q61mo; 3-367mo in year 2, then annually.   RTC 3 month.  Clide Cliff, MD Gynecologic Oncology    ----------------------- Reason for Visit: Follow-up  Treatment History: Oncology History  Vulvar cancer (HCC)  02/21/2023 Initial Biopsy   Vulvar biopsy: SCC with DOI 4mm   02/21/2023 Initial Diagnosis   Vulvar cancer (HCC)   04/05/2023 Surgery   Radical partial vulvectomy, bilateral inguinofemoral sentinel lymph node evaluation and biopsies   04/05/2023 Cancer Staging   Staging form: Vulva, AJCC V9 - Pathologic stage from 04/05/2023: FIGO Stage IB (pT1b, pN0(sn), cM0) - Signed by Clide Cliff, MD on 04/26/2023 Histopathologic type: Squamous cell carcinoma, NOS Stage prefix: Initial diagnosis Method of lymph node assessment: Sentinel lymph node biopsy Histologic grade (G): G3 Histologic grading system: 3 grade system Lymph-vascular invasion (LVI): LVI present/identified, NOS P16 overexpression: Negative Differentiated vulvar intraepithelial neoplasia (dVIN): Present   04/05/2023 Pathology Results   Diagnosis   A. Lymph node, sentinel, right inguinal, excision - One lymph node with no metastatic carcinoma identified by H&E or  pancytokeratin AE1/AE3 immunohistochemical stain (0/1)   B. Lymph node, sentinel, left inguinal, excision - One lymph node with no metastatic carcinoma identified by H&E or pancytokeratin AE1/AE3 immunohistochemical stain (0/1)   C. 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  Electronically signed by Michaelle Birks, MD on 04/10/2023 at  9:57 AM  Diagnosis Comment   Immunohistochemical stains are performed on C5 and show that the tumor is negative for p16 with apparent null pattern negative staining for p53, consistent with HPV independent squamous cell carcinoma. CD31 stain on block C8 shows apparent endothelial cells around an area of tumor, supporting the presence of focal lymphovascular space invasion.   Synoptic Report VULVA VULVA - All Specimens 9th Edition - Protocol posted: 12/06/2022  SPECIMEN    Procedure:    Partial vulvectomy  TUMOR    Tumor Focality:    Unifocal    Tumor Site:    Anterior midline vulva    Tumor Size:    Greatest Dimension (Centimeters): 0.6 cm    Histologic Type:    Squamous cell carcinoma, HPV-independent    Histologic Grade:    G3, poorly differentiated    Depth of Invasion (Millimeters) (FIGO 2021 method):    2 mm    Tumor Growth Pattern:    Partially pushing  but focally infiltrative    Other Tissue / Organ Involvement:    Not applicable    Lymphatic and / or Vascular Invasion:     Present  MARGINS    Margin Status for Invasive Carcinoma:    All margins negative for invasive carcinoma      Closest Margin(s) to Invasive Carcinoma:    Peripheral: 3-6:00      Distance from Invasive Carcinoma to Closest Margin:    3 mm    Margin Status for Precursor Lesions of Squamous Cell Carcinoma and / or Paget Disease:    All margins negative for squamous precursor lesions  REGIONAL LYMPH NODES    Regional Lymph Node Status:          :    All regional lymph nodes negative for tumor cells      Total Number of Lymph Nodes Examined:    2        Nodal Site(s) Examined:    Right inguinal: sentinel        Nodal Site(s) Examined:    Left inguinal: sentinel      Number of Sentinel Nodes Examined:    2  pTNM CLASSIFICATION (AJCC Version 9)    Reporting of pT, pN, and (when applicable) pM categories is based on information available to the pathologist at the time the report is issued. As per the AJCC (Chapter 1, 8th Ed.) it is the managing physician's responsibility to establish the final pathologic stage based upon all pertinent information, including but potentially not limited to this pathology report.    pT Category:    pT1b    pN Category:    pN0    N Suffix:    (sn)  FIGO STAGE    FIGO Stage:    IB  ADDITIONAL FINDINGS    Additional Findings:    Differentiated vulvar intraepithelial neoplasia (dVIN)  SPECIAL STUDIES    p16 Immunohistochemistry:    Negative    p53 Immunohistochemistry:    Abnormal      :    Absent / null     Comment(s):    See diagnosis comment       Interval History: Patient reports that she is overall feeling well.  She denies pain.  She is having normal bowel movements.  She has been putting some Vaseline in the right groin due to some redness.  She feels that this has helped and the redness has gone away.  She denies any fevers or chills.   Past Medical/Surgical History: Past Medical History:  Diagnosis Date   Arthritis    Cancer (HCC)    Vulvar    Diverticulosis of intestine    GERD (gastroesophageal reflux disease)    Mixed hyperlipidemia    Osteopenia 04/05/2021   Prediabetes    TIA (transient ischemic attack)    Vitamin B12 deficiency     Past Surgical History:  Procedure Laterality Date   ABDOMINAL HYSTERECTOMY      Family History  Problem Relation Age of Onset   Kidney disease Mother    Heart failure Father    Heart disease Father    Breast cancer Sister    Alzheimer's disease Sister    Diabetes Brother    Cancer Brother        unknown   Heart disease Brother    Cancer Other    Ovarian cancer Neg Hx    Colon cancer Neg Hx    Uterine cancer Neg Hx  Social History   Socioeconomic History   Marital status: Married    Spouse name: Not on file   Number of children: 5   Years of education: Not on file   Highest education level: Not on file  Occupational History   Occupation: retired  Tobacco Use   Smoking status: Former    Types: Cigarettes   Smokeless tobacco: Never  Building services engineer Use: Never used  Substance and Sexual Activity   Alcohol use: Yes    Comment: occ   Drug use: No   Sexual activity: Not Currently  Other Topics Concern   Not on file  Social History Narrative   Visits with daughter, Inetta Fermo a lot   Other children live out of state   Daughter in [place] is a Librarian, academic and their POA   Social Determinants of Health   Financial Resource Strain: Low Risk  (02/12/2023)   Overall Financial Resource Strain (CARDIA)    Difficulty of Paying Living Expenses: Not hard at all  Food Insecurity: No Food Insecurity (03/01/2023)   Hunger Vital Sign    Worried About Running Out of Food in the Last Year: Never true    Ran Out of Food in the Last Year: Never true  Transportation Needs: No Transportation Needs (04/09/2023)   PRAPARE - Administrator, Civil Service (Medical): No    Lack of Transportation (Non-Medical): No  Physical Activity: Insufficiently Active (02/12/2023)    Exercise Vital Sign    Days of Exercise per Week: 3 days    Minutes of Exercise per Session: 30 min  Stress: No Stress Concern Present (02/12/2023)   [employer] of Occupational Health - Occupational Stress Questionnaire    Feeling of Stress : Not at all  Social Connections: Socially Isolated (02/12/2023)   Social Connection and Isolation Panel [NHANES]    Frequency of Communication with Friends and Family: More than three times a week    Frequency of Social Gatherings with Friends and Family: More than three times a week    Attends Religious Services: Never    Database administrator or Organizations: No    Attends Banker Meetings: Never    Marital Status: Widowed    Current Medications:  Current Outpatient Medications:    acetaminophen (TYLENOL) 500 MG tablet, Take 1,000 mg by mouth every 6 (six) hours as needed for mild pain., Disp: , Rfl:    busPIRone (BUSPAR) 7.5 MG tablet, Take 1 tablet (7.5 mg total) by mouth 2 (two) times daily as needed., Disp: 60 tablet, Rfl: 2   Garlic 400 MG TBEC, Take by mouth., Disp: , Rfl:    latanoprost (XALATAN) 0.005 % ophthalmic solution, 1 drop at bedtime., Disp: , Rfl:    MAGNESIUM PO, Take 1 tablet by mouth daily., Disp: , Rfl:    Multiple Vitamin (MULTIVITAMIN) tablet, Take 1 tablet by mouth daily., Disp: , Rfl:    omeprazole (PRILOSEC) 10 MG capsule, Take 10 mg by mouth daily., Disp: , Rfl:    pravastatin (PRAVACHOL) 40 MG tablet, Take 1 tablet (40 mg total) by mouth once a week., Disp: 12 tablet, Rfl: 1   timolol (TIMOPTIC) 0.5 % ophthalmic solution, 1 drop every morning., Disp: , Rfl:   Review of Symptoms: Complete 10-system review is positive for: Fatigue, dizziness, depression, swelling of legs  Physical Exam: BP 131/66 (BP Location: Left Arm, Patient Position: Sitting)   Pulse (!) 59   Temp 98.2 F (36.8 C) (Oral)  Resp 16   Wt 173 lb (78.5 kg)   SpO2 98%   BMI 26.30 kg/m  General: Alert, oriented, no acute  distress. HEENT: Normocephalic, atraumatic. Neck symmetric without masses. Sclera anicteric.  Chest: Normal work of breathing.  Abdomen: Soft, nontender.  Lymph nodes: bilateral groin incisions healing well.  Swelling resolved.  Bilateral incisions soft.   Extremities: Grossly normal range of motion.  Warm, well perfused.  No edema bilaterally. Skin: No rashes or lesions noted. GU: External genitalia with evidence of recent anterior vulvectomy, well-healed.  Small area of granulation tissue treated with silver nitrate.  Exam chaperoned by Terald Sleeper, LPN   Laboratory & Radiologic Studies: None

## 2023-05-23 NOTE — Patient Instructions (Signed)
It was a pleasure to see you in clinic today. - You are healing. No restrictions - Return visit planned for 3 months  Thank you very much for allowing me to provide care for you today.  I appreciate your confidence in choosing our Gynecologic Oncology team at [facility].  If you have any questions about your visit today please call our office or send [place] a MyChart message and we will get back to you as soon as possible.

## 2023-05-28 ENCOUNTER — Other Ambulatory Visit: Payer: Self-pay | Admitting: Family Medicine

## 2023-05-28 DIAGNOSIS — E782 Mixed hyperlipidemia: Secondary | ICD-10-CM

## 2023-05-28 DIAGNOSIS — Z8673 Personal history of transient ischemic attack (TIA), and cerebral infarction without residual deficits: Secondary | ICD-10-CM

## 2023-05-28 NOTE — Telephone Encounter (Signed)
Debra Olsen pt NTBS by new provider RF not sent

## 2023-05-29 NOTE — Telephone Encounter (Signed)
Pt refused to make an appt and she said she wasn't taking any medications.

## 2023-05-30 ENCOUNTER — Encounter: Payer: Self-pay | Admitting: Psychiatry

## 2023-06-21 DIAGNOSIS — D485 Neoplasm of uncertain behavior of skin: Secondary | ICD-10-CM | POA: Diagnosis not present

## 2023-06-21 DIAGNOSIS — Z1283 Encounter for screening for malignant neoplasm of skin: Secondary | ICD-10-CM | POA: Diagnosis not present

## 2023-06-21 DIAGNOSIS — B009 Herpesviral infection, unspecified: Secondary | ICD-10-CM | POA: Diagnosis not present

## 2023-06-21 DIAGNOSIS — L570 Actinic keratosis: Secondary | ICD-10-CM | POA: Diagnosis not present

## 2023-07-21 ENCOUNTER — Encounter (HOSPITAL_COMMUNITY): Payer: Self-pay | Admitting: Emergency Medicine

## 2023-07-21 ENCOUNTER — Emergency Department (HOSPITAL_COMMUNITY)
Admission: EM | Admit: 2023-07-21 | Discharge: 2023-07-21 | Disposition: A | Discharge status: Home / Self Care | Payer: Medicare Other | Attending: Emergency Medicine | Admitting: Emergency Medicine

## 2023-07-21 ENCOUNTER — Emergency Department (HOSPITAL_COMMUNITY): Payer: Medicare Other

## 2023-07-21 ENCOUNTER — Other Ambulatory Visit: Payer: Self-pay

## 2023-07-21 DIAGNOSIS — Z8544 Personal history of malignant neoplasm of other female genital organs: Secondary | ICD-10-CM | POA: Insufficient documentation

## 2023-07-21 DIAGNOSIS — X58XXXA Exposure to other specified factors, initial encounter: Secondary | ICD-10-CM | POA: Insufficient documentation

## 2023-07-21 DIAGNOSIS — S32010A Wedge compression fracture of first lumbar vertebra, initial encounter for closed fracture: Secondary | ICD-10-CM | POA: Insufficient documentation

## 2023-07-21 DIAGNOSIS — N3000 Acute cystitis without hematuria: Secondary | ICD-10-CM | POA: Insufficient documentation

## 2023-07-21 DIAGNOSIS — K8020 Calculus of gallbladder without cholecystitis without obstruction: Secondary | ICD-10-CM | POA: Diagnosis not present

## 2023-07-21 DIAGNOSIS — S3992XA Unspecified injury of lower back, initial encounter: Secondary | ICD-10-CM | POA: Diagnosis present

## 2023-07-21 DIAGNOSIS — K5730 Diverticulosis of large intestine without perforation or abscess without bleeding: Secondary | ICD-10-CM | POA: Diagnosis not present

## 2023-07-21 LAB — CBC WITH DIFFERENTIAL/PLATELET
Abs Immature Granulocytes: 0.02 10*3/uL (ref 0.00–0.07)
Basophils Absolute: 0.2 10*3/uL — ABNORMAL HIGH (ref 0.0–0.1)
Basophils Relative: 2 %
Eosinophils Absolute: 0.3 10*3/uL (ref 0.0–0.5)
Eosinophils Relative: 4 %
HCT: 40.5 % (ref 36.0–46.0)
Hemoglobin: 13.4 g/dL (ref 12.0–15.0)
Immature Granulocytes: 0 %
Lymphocytes Relative: 21 %
Lymphs Abs: 1.7 10*3/uL (ref 0.7–4.0)
MCH: 31.4 pg (ref 26.0–34.0)
MCHC: 33.1 g/dL (ref 30.0–36.0)
MCV: 94.8 fL (ref 80.0–100.0)
Monocytes Absolute: 1 10*3/uL (ref 0.1–1.0)
Monocytes Relative: 11 %
Neutro Abs: 5.3 10*3/uL (ref 1.7–7.7)
Neutrophils Relative %: 62 %
Platelets: 283 10*3/uL (ref 150–400)
RBC: 4.27 MIL/uL (ref 3.87–5.11)
RDW: 12.5 % (ref 11.5–15.5)
WBC: 8.5 10*3/uL (ref 4.0–10.5)
nRBC: 0 % (ref 0.0–0.2)

## 2023-07-21 LAB — BASIC METABOLIC PANEL
Anion gap: 7 (ref 5–15)
BUN: 11 mg/dL (ref 8–23)
CO2: 27 mmol/L (ref 22–32)
Calcium: 8.9 mg/dL (ref 8.9–10.3)
Chloride: 101 mmol/L (ref 98–111)
Creatinine, Ser: 0.85 mg/dL (ref 0.44–1.00)
GFR, Estimated: >60 mL/min (ref >60)
Glucose, Bld: 126 mg/dL — ABNORMAL HIGH (ref 70–99)
Potassium: 4.4 mmol/L (ref 3.5–5.1)
Sodium: 135 mmol/L (ref 135–145)

## 2023-07-21 LAB — URINALYSIS, ROUTINE W REFLEX MICROSCOPIC
Bilirubin Urine: NEGATIVE
Glucose, UA: NEGATIVE mg/dL
Ketones, ur: NEGATIVE mg/dL
Nitrite: POSITIVE — AB
Protein, ur: NEGATIVE mg/dL
Specific Gravity, Urine: 1.002 — ABNORMAL LOW (ref 1.005–1.030)
pH: 8 (ref 5.0–8.0)

## 2023-07-21 MED ORDER — CEPHALEXIN 500 MG PO CAPS: 500.0000 mg | ORAL_CAPSULE | Freq: Two times a day (BID) | ORAL | 0 refills | Status: AC

## 2023-07-21 MED ORDER — CEPHALEXIN 500 MG PO CAPS
500.0000 mg | ORAL_CAPSULE | Freq: Once | ORAL | Status: AC
  Administered 2023-07-21: 500 mg via ORAL
  Filled 2023-07-21: qty 1

## 2023-07-21 MED ORDER — IBUPROFEN 400 MG PO TABS
600.0000 mg | ORAL_TABLET | Freq: Once | ORAL | Status: AC
  Administered 2023-07-21: 600 mg via ORAL
  Filled 2023-07-21: qty 2

## 2023-07-21 NOTE — ED Provider Notes (Signed)
[place] EMERGENCY DEPARTMENT AT [facility]  Provider Note  CSN: [id number] Arrival date & time: 07/21/23 0505  History Chief Complaint  Patient presents with   Back Pain    Debra Olsen is a 87 y.o. female with history of vulvar cancer, s/p resection in Apr 2024, no current treatment, no known bony mets reports about a week of L lower back pain, non radiating, worse with movement. No associated fever, N/V or dysuria. She has been constipated but took an OTC laxative and had BM yesterday. Pain was improved with motrin she took at home yesterday.    Home Medications Prior to Admission medications   Medication Sig Start Date End Date Taking? Authorizing Provider  cephALEXin (KEFLEX) 500 MG capsule Take 1 capsule (500 mg total) by mouth 2 (two) times daily for 7 days. 07/21/23 07/28/23 Yes Pollyann Savoy, MD  acetaminophen (TYLENOL) 500 MG tablet Take 1,000 mg by mouth every 6 (six) hours as needed for mild pain.    [provider]  busPIRone (BUSPAR) 7.5 MG tablet Take 1 tablet (7.5 mg total) by mouth 2 (two) times daily as needed. 07/26/22   Gwenlyn Fudge, FNP  Garlic 400 MG TBEC Take by mouth.    [provider]  latanoprost (XALATAN) 0.005 % ophthalmic solution 1 drop at bedtime. 01/06/22   [provider]  MAGNESIUM PO Take 1 tablet by mouth daily.    [provider]  Multiple Vitamin (MULTIVITAMIN) tablet Take 1 tablet by mouth daily.    [provider]  omeprazole (PRILOSEC) 10 MG capsule Take 10 mg by mouth daily.    [provider]  pravastatin (PRAVACHOL) 40 MG tablet Take 1 tablet (40 mg total) by mouth once a week. 07/26/22   Gwenlyn Fudge, FNP  timolol (TIMOPTIC) 0.5 % ophthalmic solution 1 drop every morning. 03/19/23   [provider]     Allergies    Lidocaine and Penicillins   Review of Systems   Review of Systems Please see HPI for pertinent positives and negatives  Physical Exam BP  (!) 157/90 (BP Location: Right Arm)   Pulse 91   Temp 98.4 F (36.9 C) (Oral)   Resp 20   Ht 5\' 8"  (1.727 m)   Wt 77.1 kg   SpO2 95%   BMI 25.85 kg/m   Physical Exam Vitals and nursing note reviewed.  Constitutional:      Appearance: Normal appearance.  HENT:     Head: Normocephalic and atraumatic.     Nose: Nose normal.     Mouth/Throat:     Mouth: Mucous membranes are moist.  Eyes:     Extraocular Movements: Extraocular movements intact.     Conjunctiva/sclera: Conjunctivae normal.  Cardiovascular:     Rate and Rhythm: Normal rate.  Pulmonary:     Effort: Pulmonary effort is normal.     Breath sounds: Normal breath sounds.  Abdominal:     General: Abdomen is flat.     Palpations: Abdomen is soft.     Tenderness: There is no abdominal tenderness.  Musculoskeletal:        General: Tenderness (L lumbar paraspinal muscles) present. No swelling. Normal range of motion.     Cervical back: Neck supple.  Skin:    General: Skin is warm and dry.  Neurological:     General: No focal deficit present.     Mental Status: She is alert and oriented to person, place, and time.  Cranial Nerves: No cranial nerve deficit.     Sensory: No sensory deficit.     Motor: No weakness.  Psychiatric:        Mood and Affect: Mood normal.     ED Results / Procedures / Treatments   EKG None  Procedures Procedures  Medications Ordered in the ED Medications  cephALEXin (KEFLEX) capsule 500 mg (has no administration in time range)  ibuprofen (ADVIL) tablet 600 mg (600 mg Oral Given 07/21/23 0557)    Initial Impression and Plan  Patient here with left lower back pain. Consider kidney stones, pyelo, MSK or bony mets. Will check labs, send for CT. Motrin for pain.   ED Course   Clinical Course as of 07/21/23 0654  Sat Jul 21, 2023  0621 CBC is normal.  [CS]  0631 BMP is unremarkable.  [CS]  [id number] I personally viewed the images from radiology studies and agree with radiologist  interpretation: CT neg for kidney stones or signs of metastatic disease, but there is a compression fracture which may be causing her pain. Discussed this with patient and family at bedside. They do not want anything stronger than motrin which has been helping her symptoms. If UA is clear, plan discharge with outpatient Neurosurg follow up if pain not controlled for consideration of kyphoplasty. She has been able to get up and walk to the bathroom here with minimal difficulty.  [CS]  [id number] UA with signs of infection. Will treat with Keflex. Otherwise dispo as above.  [CS]    Clinical Course User Index [CS] Pollyann Savoy, MD     MDM Rules/Calculators/A&P Medical Decision Making Given presenting complaint, I considered that admission might be necessary. After review of results from ED lab and/or imaging studies, admission to the hospital is not indicated at this time.    Problems Addressed: Acute cystitis without hematuria: acute illness or injury Compression fracture of L1 vertebra, initial encounter [facility]): acute illness or injury  Amount and/or Complexity of Data Reviewed Labs: ordered. Decision-making details documented in ED Course. Radiology: ordered and independent interpretation performed. Decision-making details documented in ED Course.  Risk Prescription drug management. Decision regarding hospitalization.     Final Clinical Impression(s) / ED Diagnoses Final diagnoses:  Acute cystitis without hematuria  Compression fracture of L1 vertebra, initial encounter [facility])    Rx / DC Orders ED Discharge Orders          Ordered    cephALEXin (KEFLEX) 500 MG capsule  2 times daily        07/21/23 0654             Pollyann Savoy, MD 07/21/23 [phone]

## 2023-07-21 NOTE — ED Notes (Signed)
Patient transported to CT 

## 2023-07-21 NOTE — ED Triage Notes (Signed)
Pt with c/o lower back pain but states worse on L side. Daughter states pt has been constipated x 1 week but that she took some medication and finally had a BM today.

## 2023-07-25 DIAGNOSIS — S32010D Wedge compression fracture of first lumbar vertebra, subsequent encounter for fracture with routine healing: Secondary | ICD-10-CM | POA: Diagnosis not present

## 2023-07-26 ENCOUNTER — Telehealth: Payer: Self-pay

## 2023-07-26 NOTE — Telephone Encounter (Signed)
Transition Care Management Follow-up Telephone Call Date of discharge and from where: 07/21/2023 [facility] How have you been since you were released from the hospital? Patient stated she is still in pain and not sleeping well. Any questions or concerns? No  Items Reviewed: Did the pt receive and understand the discharge instructions provided? Yes  Medications obtained and verified? Yes  Other? No  Any new allergies since your discharge? No  Dietary orders reviewed? Yes Do you have support at home? Yes   Follow up appointments reviewed:  PCP Hospital f/u appt confirmed? No  Scheduled to see  on  @ . Specialist Hospital f/u appt confirmed?  Patient stated she has an upcoming appointment with a Orthopedic Surgeon.  Scheduled to see  on  @ . Are transportation arrangements needed? No  If their condition worsens, is the pt aware to call PCP or go to the Emergency Dept.? Yes Was the patient provided with contact information for the PCP's office or ED? Yes Was to pt encouraged to call back with questions or concerns? Yes  Calypso Hagarty [facility]  [facility] Population Health Community Resource Care Guide   [email or web address]  ?? [phone]   Website: [email or web address]  [email or web address]

## 2023-08-21 DIAGNOSIS — H401221 Low-tension glaucoma, left eye, mild stage: Secondary | ICD-10-CM | POA: Diagnosis not present

## 2023-08-22 DIAGNOSIS — S32010D Wedge compression fracture of first lumbar vertebra, subsequent encounter for fracture with routine healing: Secondary | ICD-10-CM | POA: Diagnosis not present

## 2023-09-03 ENCOUNTER — Inpatient Hospital Stay: Payer: Medicare Other | Attending: Psychiatry | Admitting: Psychiatry

## 2023-09-03 VITALS — BP 160/80 | HR 63 | Temp 98.3°F | Resp 18 | Ht 68.0 in | Wt 169.0 lb

## 2023-09-03 DIAGNOSIS — N9089 Other specified noninflammatory disorders of vulva and perineum: Secondary | ICD-10-CM | POA: Diagnosis not present

## 2023-09-03 DIAGNOSIS — Z9079 Acquired absence of other genital organ(s): Secondary | ICD-10-CM | POA: Insufficient documentation

## 2023-09-03 DIAGNOSIS — I10 Essential (primary) hypertension: Secondary | ICD-10-CM | POA: Insufficient documentation

## 2023-09-03 DIAGNOSIS — Z8544 Personal history of malignant neoplasm of other female genital organs: Secondary | ICD-10-CM | POA: Diagnosis not present

## 2023-09-03 DIAGNOSIS — C519 Malignant neoplasm of vulva, unspecified: Secondary | ICD-10-CM | POA: Diagnosis not present

## 2023-09-03 DIAGNOSIS — I159 Secondary hypertension, unspecified: Secondary | ICD-10-CM

## 2023-09-03 NOTE — Patient Instructions (Signed)
It was a pleasure to see you in clinic today. - Took a biopsy today. Will let you know the results when available. If normal, plan follow-up in 3months. - PET scan on 10/11/23.  Thank you very much for allowing me to provide care for you today.  I appreciate your confidence in choosing our Gynecologic Oncology team at [facility].  If you have any questions about your visit today please call our office or send [place] a MyChart message and we will get back to you as soon as possible.

## 2023-09-03 NOTE — Progress Notes (Addendum)
Gynecologic Oncology Return Clinic Visit  Date of Service: 09/03/2023 Referring Provider:  Ernestina Penna, MD [address] [place],  [place] [address]  Assessment & Plan: Debra Olsen is a 87 y.o. 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  SCC of the vulva: - NED on exam today apart from area of pink tissue arising from scar, likely granulation tissue. Biopsied (completely removed with biopsy). - Will follow-up path results. - Signs symptoms of recurrence reviewed. - Given risk factors that increase her risk for recurrence including positive LVSI, grade 3, and close margin, plan 45mo PET, scheduled for 10/11/23. - Surveillance q51mo; 3-61mo in year 2, then annually.   Hypertension: - Elevated BP on exam. Asymptomatic. No hypertensive urgency. - Pt advised to check BP at home. If continues to be elevated, advised to follow-up with PCP.   RTC 55mo.  Clide Cliff, MD Gynecologic Oncology  Medical Decision Making I personally spent  TOTAL 30 minutes face-to-face and non-face-to-face in the care of this patient, which includes all pre, intra, and post visit time on the date of service.   ----------------------- Reason for Visit: Surveillance  Treatment History: Oncology History  Vulvar cancer (HCC)  02/21/2023 Initial Biopsy   Vulvar biopsy: SCC with DOI 4mm   02/21/2023 Initial Diagnosis   Vulvar cancer (HCC)   04/05/2023 Surgery   Radical partial vulvectomy, bilateral inguinofemoral sentinel lymph node evaluation and biopsies   04/05/2023 Cancer Staging   Staging form: Vulva, AJCC V9 - Pathologic stage from 04/05/2023: FIGO Stage IB (pT1b, pN0(sn), cM0) - Signed by Clide Cliff, MD on 04/26/2023 Histopathologic type: Squamous cell carcinoma, NOS Stage prefix: Initial  diagnosis Method of lymph node assessment: Sentinel lymph node biopsy Histologic grade (G): G3 Histologic grading system: 3 grade system Lymph-vascular invasion (LVI): LVI present/identified, NOS P16 overexpression: Negative Differentiated vulvar intraepithelial neoplasia (dVIN): Present   04/05/2023 Pathology Results   Diagnosis   A. Lymph node, sentinel, right inguinal, excision - One lymph node with no metastatic carcinoma identified by H&E or pancytokeratin AE1/AE3 immunohistochemical stain (0/1)   B. Lymph node, sentinel, left inguinal, excision - One lymph node with no metastatic carcinoma identified by H&E or pancytokeratin AE1/AE3 immunohistochemical stain (0/1)   C. 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  Electronically signed by Michaelle Birks, MD on 04/10/2023 at  9:57 AM  Diagnosis Comment   Immunohistochemical stains are performed on C5 and show that the tumor is negative for p16 with apparent null pattern negative staining for p53, consistent with HPV independent squamous cell carcinoma. CD31 stain on block C8 shows apparent endothelial cells around an area of tumor, supporting the presence of focal lymphovascular space invasion.   Synoptic Report VULVA VULVA - All Specimens 9th Edition - Protocol posted: 12/06/2022  SPECIMEN    Procedure:    Partial vulvectomy  TUMOR    Tumor  Focality:     Unifocal    Tumor Site:    Anterior midline vulva    Tumor Size:    Greatest Dimension (Centimeters): 0.6 cm    Histologic Type:    Squamous cell carcinoma, HPV-independent    Histologic Grade:    G3, poorly differentiated    Depth of Invasion (Millimeters) (FIGO 2021 method):    2 mm    Tumor Growth Pattern:    Partially pushing but focally infiltrative    Other Tissue / Organ Involvement:    Not applicable    Lymphatic and / or Vascular Invasion:    Present  MARGINS    Margin Status for Invasive Carcinoma:    All margins negative for invasive carcinoma      Closest Margin(s) to Invasive Carcinoma:    Peripheral: 3-6:00      Distance from Invasive Carcinoma to Closest Margin:    3 mm    Margin Status for Precursor Lesions of Squamous Cell Carcinoma and / or Paget Disease:    All margins negative for squamous precursor lesions  REGIONAL LYMPH NODES    Regional Lymph Node Status:          :    All regional lymph nodes negative for tumor cells      Total Number of Lymph Nodes Examined:    2        Nodal Site(s) Examined:    Right inguinal: sentinel        Nodal Site(s) Examined:    Left inguinal: sentinel      Number of Sentinel Nodes Examined:    2  pTNM CLASSIFICATION (AJCC Version 9)    Reporting of pT, pN, and (when applicable) pM categories is based on information available to the pathologist at the time the report is issued. As per the AJCC (Chapter 1, 8th Ed.) it is the managing physician's responsibility to establish the final pathologic stage based upon all pertinent information, including but potentially not limited to this pathology report.    pT Category:    pT1b    pN Category:    pN0    N Suffix:    (sn)  FIGO STAGE    FIGO Stage:    IB  ADDITIONAL FINDINGS    Additional Findings:    Differentiated vulvar intraepithelial neoplasia (dVIN)  SPECIAL STUDIES    p16 Immunohistochemistry:    Negative    p53 Immunohistochemistry:    Abnormal      :    Absent / null      Comment(s):    See diagnosis comment       Interval History: Pt reports that she is overall doing well. Denies pain, vaginal/vulvar itching, bleeding. Denies change in bowel or bladder habits. No weight loss. Denies nausea/vomiting.    Past Medical/Surgical History: Past Medical History:  Diagnosis Date   Arthritis    Cancer (HCC)    Vulvar   Diverticulosis of intestine    GERD (gastroesophageal reflux disease)    Mixed hyperlipidemia    Osteopenia 04/05/2021   Prediabetes    TIA (transient ischemic attack)    Vitamin B12 deficiency     Past Surgical History:  Procedure Laterality Date   ABDOMINAL HYSTERECTOMY     VULVECTOMY      Family History  Problem Relation Age of Onset   Kidney disease Mother    Heart failure Father    Heart disease Father    Breast cancer Sister  Alzheimer's disease Sister    Diabetes Brother    Cancer Brother        unknown   Heart disease Brother    Cancer Other    Ovarian cancer Neg Hx    Colon cancer Neg Hx    Uterine cancer Neg Hx     Social History   Socioeconomic History   Marital status: Widowed    Spouse name: Not on file   Number of children: 5   Years of education: Not on file   Highest education level: Not on file  Occupational History   Occupation: retired  Tobacco Use   Smoking status: Former    Types: Cigarettes   Smokeless tobacco: Never  Vaping Use   Vaping status: Never Used  Substance and Sexual Activity   Alcohol use: Yes    Comment: occ   Drug use: No   Sexual activity: Not Currently  Other Topics Concern   Not on file  Social History Narrative   Visits with daughter, Inetta Fermo a lot   Other children live out of state   Daughter in [place] is a Librarian, academic and their POA   Social Determinants of Health   Financial Resource Strain: Low Risk  (02/12/2023)   Overall Financial Resource Strain (CARDIA)    Difficulty of Paying Living Expenses: Not hard at all  Food Insecurity: No Food Insecurity (03/01/2023)    Hunger Vital Sign    Worried About Running Out of Food in the Last Year: Never true    Ran Out of Food in the Last Year: Never true  Transportation Needs: No Transportation Needs (04/09/2023)   PRAPARE - Administrator, Civil Service (Medical): No    Lack of Transportation (Non-Medical): No  Physical Activity: Insufficiently Active (02/12/2023)   Exercise Vital Sign    Days of Exercise per Week: 3 days    Minutes of Exercise per Session: 30 min  Stress: No Stress Concern Present (02/12/2023)   [employer] of Occupational Health - Occupational Stress Questionnaire    Feeling of Stress : Not at all  Social Connections: Socially Isolated (02/12/2023)   Social Connection and Isolation Panel [NHANES]    Frequency of Communication with Friends and Family: More than three times a week    Frequency of Social Gatherings with Friends and Family: More than three times a week    Attends Religious Services: Never    Database administrator or Organizations: No    Attends Banker Meetings: Never    Marital Status: Widowed    Current Medications:  Current Outpatient Medications:    acetaminophen (TYLENOL) 500 MG tablet, Take 1,000 mg by mouth every 6 (six) hours as needed for mild pain., Disp: , Rfl:    Garlic 400 MG TBEC, Take by mouth., Disp: , Rfl:    latanoprost (XALATAN) 0.005 % ophthalmic solution, 1 drop at bedtime., Disp: , Rfl:    MAGNESIUM PO, Take 1 tablet by mouth daily., Disp: , Rfl:    Multiple Vitamin (MULTIVITAMIN) tablet, Take 1 tablet by mouth daily., Disp: , Rfl:    omeprazole (PRILOSEC) 10 MG capsule, Take 10 mg by mouth daily., Disp: , Rfl:    timolol (TIMOPTIC) 0.5 % ophthalmic solution, 1 drop every morning., Disp: , Rfl:   Review of Symptoms: Complete 10-system review is positive for: problem walking  Physical Exam: BP (!) 156/68 (BP Location: Left Arm, Patient Position: Sitting)   Pulse 63   Temp 98.3 F (36.8  C) (Oral)   Resp 18   Ht  5\' 8"  (1.727 m)   Wt 169 lb (76.7 kg)   BMI 25.70 kg/m  General: Alert, oriented, no acute distress. HEENT: Normocephalic, atraumatic. Neck symmetric without masses. Sclera anicteric.  Chest: Normal work of breathing. Clear to auscultation bilaterally.   Cardiovascular: Regular rate and rhythm, no murmurs. Abdomen: Soft, nontender.  Normoactive bowel sounds.  No masses appreciated.   Extremities: Grossly normal range of motion.  Warm, well perfused.  No edema bilaterally. Skin: No rashes or lesions noted. Lymphatics: No cervical, supraclavicular, or inguinal adenopathy. GU: External genitalia with evidence of prior anterior vulvectomy and groin biopsy. Well healed scar. Area of pink polypoid tissue arise from suture line of right medial anterior vulva, likely granulation tissue. Otherwise normal appearance.  vagina without lesions. Bimanual exam reveals smooth vaginal cuff, no nodularity or pelvic mass. Exam chaperoned by Warner Mccreedy, NP  VULVAR BIOPSY  Debra Olsen is a 87 y.o. woman who presents today for a vulvar biopsy, see details of the visit above.  The procedure was explained to the patient and verbal consent was obtained prior to the procedure.  The skin was cleaned with Betadine.  1 ml of 1% lidocaine was injected at the planned biopsy site.  A tischler biopsy forcep was used to remove the pink polypoid/granulation like tissue from the vulva.  Hemostasis was achieved with silver nitrate.  Patient tolerated the procedure well.   Laboratory & Radiologic Studies: None

## 2023-09-05 ENCOUNTER — Telehealth: Payer: Self-pay | Admitting: *Deleted

## 2023-09-05 LAB — SURGICAL PATHOLOGY

## 2023-09-05 NOTE — Telephone Encounter (Signed)
Attempted to reach Debra Olsen. Unable to leave voicemail because she doesn't have a mailbox that is set up. Called Patient's daughter and relayed message to have patient call the office at her earliest convenience.

## 2023-09-05 NOTE — Telephone Encounter (Signed)
-----   Message from Debra Olsen sent at 09/05/2023  3:18 PM EDT ----- Please let the patient know the biopsy she had in the office at her visit with Dr. Alvester Morin is showing the healing tissue she thought it was and NO CANCER or PRECANCER. Good news!

## 2023-09-06 NOTE — Telephone Encounter (Signed)
Ms.Osias returned call. She is aware of normal biopsy results.  Pt asked if she could put vasoline on the biopsy site d/t it burns when she urinates. Advised that would be fine, also advised a peri bottle/squirt bottle can be used with warm water. She voiced an understanding.

## 2023-09-09 ENCOUNTER — Encounter: Payer: Self-pay | Admitting: Psychiatry

## 2023-10-11 ENCOUNTER — Other Ambulatory Visit (HOSPITAL_COMMUNITY): Payer: Medicare Other

## 2023-10-11 ENCOUNTER — Encounter (HOSPITAL_COMMUNITY)
Admission: RE | Admit: 2023-10-11 | Discharge: 2023-10-11 | Disposition: A | Discharge status: Home / Self Care | Payer: Medicare Other | Source: Ambulatory Visit | Attending: Psychiatry | Admitting: Psychiatry

## 2023-10-11 DIAGNOSIS — C519 Malignant neoplasm of vulva, unspecified: Secondary | ICD-10-CM | POA: Diagnosis not present

## 2023-10-11 MED ORDER — FLUDEOXYGLUCOSE F - 18 (FDG) INJECTION
8.5300 | Freq: Once | INTRAVENOUS | Status: AC | PRN
  Administered 2023-10-11: 8.53 via INTRAVENOUS

## 2023-10-22 ENCOUNTER — Ambulatory Visit: Payer: Medicare Other | Admitting: Psychiatry

## 2023-10-29 ENCOUNTER — Telehealth: Payer: Self-pay | Admitting: *Deleted

## 2023-10-29 NOTE — Telephone Encounter (Signed)
Patient's daughter called the office and left a message stating that her mother Debra Olsen hasn't heard from Dr. Alvester Morin about the Pet scan she had done on October 17 th. Nida Boatman left her number [phone] Message forwarded to provider.

## 2023-10-30 ENCOUNTER — Telehealth: Payer: Self-pay | Admitting: Psychiatry

## 2023-10-30 NOTE — Telephone Encounter (Signed)
Called pt with PET results. NED. Continue with surveillance as planned. All questions answered.

## 2023-10-30 NOTE — Telephone Encounter (Signed)
Spoke with patient's daughter Wynona Canes and relayed message from Dr. Alvester Morin that it has taken several weeks for the radiologist to read the Pet Scan and she has just received the results and will be in touch soon. Christine verbalized understanding and thanked the office for calling and she will pass this message on to her mother.

## 2023-12-03 ENCOUNTER — Inpatient Hospital Stay: Payer: Medicare Other | Attending: Psychiatry | Admitting: Psychiatry

## 2023-12-03 VITALS — BP 144/70 | HR 64 | Temp 98.7°F | Resp 20 | Wt 167.4 lb

## 2023-12-03 DIAGNOSIS — Z8544 Personal history of malignant neoplasm of other female genital organs: Secondary | ICD-10-CM | POA: Insufficient documentation

## 2023-12-03 DIAGNOSIS — Z9079 Acquired absence of other genital organ(s): Secondary | ICD-10-CM | POA: Diagnosis not present

## 2023-12-03 DIAGNOSIS — C519 Malignant neoplasm of vulva, unspecified: Secondary | ICD-10-CM

## 2023-12-03 NOTE — Progress Notes (Signed)
Gynecologic Oncology Return Clinic Visit  Date of Service: 12/03/2023 Referring Provider:  Ernestina Penna, MD [address] [place],  [place] [address]  Assessment & Plan: Debra Olsen is a 87 y.o. 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  SCC of the vulva: - Prior biopsy at last visit reviewed, granulation tissue. - NED on exam today.  - Signs symptoms of recurrence reviewed. - Given risk factors that increase her risk for recurrence including positive LVSI, grade 3, and close margin, 81mo PET completed on 10/11/23 and NED. - Repeat PET in 81mo from prior (~April 2025).  - Surveillance q92mo; 3-67mo in year 2, then annually.    RTC 59mo.  Clide Cliff, MD Gynecologic Oncology  Medical Decision Making I personally spent  TOTAL 15 minutes face-to-face and non-face-to-face in the care of this patient, which includes all pre, intra, and post visit time on the date of service.   ----------------------- Reason for Visit: Surveillance  Treatment History: Oncology History  Vulvar cancer (HCC)  02/21/2023 Initial Biopsy   Vulvar biopsy: SCC with DOI 4mm   02/21/2023 Initial Diagnosis   Vulvar cancer (HCC)   04/05/2023 Surgery   Radical partial vulvectomy, bilateral inguinofemoral sentinel lymph node evaluation and biopsies   04/05/2023 Cancer Staging   Staging form: Vulva, AJCC V9 - Pathologic stage from 04/05/2023: FIGO Stage IB (pT1b, pN0(sn), cM0) - Signed by Clide Cliff, MD on 04/26/2023 Histopathologic type: Squamous cell carcinoma, NOS Stage prefix: Initial diagnosis Method of lymph node assessment: Sentinel lymph node biopsy Histologic grade (G): G3 Histologic grading system: 3 grade system Lymph-vascular invasion (LVI): LVI present/identified, NOS P16  overexpression: Negative Differentiated vulvar intraepithelial neoplasia (dVIN): Present   04/05/2023 Pathology Results   Diagnosis   A. Lymph node, sentinel, right inguinal, excision - One lymph node with no metastatic carcinoma identified by H&E or pancytokeratin AE1/AE3 immunohistochemical stain (0/1)   B. Lymph node, sentinel, left inguinal, excision - One lymph node with no metastatic carcinoma identified by H&E or pancytokeratin AE1/AE3 immunohistochemical stain (0/1)   C. 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  Electronically signed by Michaelle Birks, MD on 04/10/2023 at  9:57 AM  Diagnosis Comment   Immunohistochemical stains are performed on C5 and show that the tumor is negative for p16 with apparent null pattern negative staining for p53, consistent with HPV independent squamous cell carcinoma. CD31 stain on block C8 shows apparent endothelial cells around an area of tumor, supporting the presence of focal lymphovascular space invasion.   Synoptic Report VULVA VULVA - All Specimens 9th Edition - Protocol posted: 12/06/2022  SPECIMEN    Procedure:    Partial vulvectomy  TUMOR    Tumor Focality:    Unifocal    Tumor Site:    Anterior midline vulva    Tumor Size:    Greatest Dimension (Centimeters): 0.6 cm  Histologic Type:    Squamous cell carcinoma, HPV-independent    Histologic Grade:    G3,  poorly differentiated    Depth of Invasion (Millimeters) (FIGO 2021 method):    2 mm    Tumor Growth Pattern:    Partially pushing but focally infiltrative    Other Tissue / Organ Involvement:    Not applicable    Lymphatic and / or Vascular Invasion:    Present  MARGINS    Margin Status for Invasive Carcinoma:    All margins negative for invasive carcinoma      Closest Margin(s) to Invasive Carcinoma:    Peripheral: 3-6:00      Distance from Invasive Carcinoma to Closest Margin:    3 mm    Margin Status for Precursor Lesions of Squamous Cell Carcinoma and / or Paget Disease:    All margins negative for squamous precursor lesions  REGIONAL LYMPH NODES    Regional Lymph Node Status:          :    All regional lymph nodes negative for tumor cells      Total Number of Lymph Nodes Examined:    2        Nodal Site(s) Examined:    Right inguinal: sentinel        Nodal Site(s) Examined:    Left inguinal: sentinel      Number of Sentinel Nodes Examined:    2  pTNM CLASSIFICATION (AJCC Version 9)    Reporting of pT, pN, and (when applicable) pM categories is based on information available to the pathologist at the time the report is issued. As per the AJCC (Chapter 1, 8th Ed.) it is the managing physician's responsibility to establish the final pathologic stage based upon all pertinent information, including but potentially not limited to this pathology report.    pT Category:    pT1b    pN Category:    pN0    N Suffix:    (sn)  FIGO STAGE    FIGO Stage:    IB  ADDITIONAL FINDINGS    Additional Findings:    Differentiated vulvar intraepithelial neoplasia (dVIN)  SPECIAL STUDIES    p16 Immunohistochemistry:    Negative    p53 Immunohistochemistry:    Abnormal      :    Absent / null     Comment(s):    See diagnosis comment       Interval History: Overall doing well.  Had her lower teeth removed and is awaiting dentures for this.  Has dentures for her upper teeth.  Otherwise denies  pain, vaginal/vulvar itching, bleeding. Denies change in bowel or bladder habits. No weight loss. Denies nausea/vomiting.    Past Medical/Surgical History: Past Medical History:  Diagnosis Date   Arthritis    Cancer (HCC)    Vulvar   Diverticulosis of intestine    GERD (gastroesophageal reflux disease)    Mixed hyperlipidemia    Osteopenia 04/05/2021   Prediabetes    TIA (transient ischemic attack)    Vitamin B12 deficiency     Past Surgical History:  Procedure Laterality Date   ABDOMINAL HYSTERECTOMY     VULVECTOMY      Family History  Problem Relation Age of Onset   Kidney disease Mother    Heart failure Father    Heart disease Father    Breast cancer Sister    Alzheimer's disease Sister    Diabetes Brother    Cancer Brother  unknown   Heart disease Brother    Cancer Other    Ovarian cancer Neg Hx    Colon cancer Neg Hx    Uterine cancer Neg Hx     Social History   Socioeconomic History   Marital status: Widowed    Spouse name: Not on file   Number of children: 5   Years of education: Not on file   Highest education level: Not on file  Occupational History   Occupation: retired  Tobacco Use   Smoking status: Former    Types: Cigarettes   Smokeless tobacco: Never  Vaping Use   Vaping status: Never Used  Substance and Sexual Activity   Alcohol use: Yes    Comment: occ   Drug use: No   Sexual activity: Not Currently  Other Topics Concern   Not on file  Social History Narrative   Visits with daughter, Inetta Fermo a lot   Other children live out of state   Daughter in [place] is a Librarian, academic and their POA   Social Drivers of Health   Financial Resource Strain: Low Risk  (02/12/2023)   Overall Financial Resource Strain (CARDIA)    Difficulty of Paying Living Expenses: Not hard at all  Food Insecurity: No Food Insecurity (03/01/2023)   Hunger Vital Sign    Worried About Running Out of Food in the Last Year: Never true    Ran Out of Food in the Last  Year: Never true  Transportation Needs: No Transportation Needs (04/09/2023)   PRAPARE - Administrator, Civil Service (Medical): No    Lack of Transportation (Non-Medical): No  Physical Activity: Insufficiently Active (02/12/2023)   Exercise Vital Sign    Days of Exercise per Week: 3 days    Minutes of Exercise per Session: 30 min  Stress: No Stress Concern Present (02/12/2023)   [employer] of Occupational Health - Occupational Stress Questionnaire    Feeling of Stress : Not at all  Social Connections: Socially Isolated (02/12/2023)   Social Connection and Isolation Panel [NHANES]    Frequency of Communication with Friends and Family: More than three times a week    Frequency of Social Gatherings with Friends and Family: More than three times a week    Attends Religious Services: Never    Database administrator or Organizations: No    Attends Banker Meetings: Never    Marital Status: Widowed    Current Medications:  Current Outpatient Medications:    acetaminophen (TYLENOL) 500 MG tablet, Take 1,000 mg by mouth every 6 (six) hours as needed for mild pain., Disp: , Rfl:    latanoprost (XALATAN) 0.005 % ophthalmic solution, 1 drop at bedtime., Disp: , Rfl:    MAGNESIUM PO, Take 1 tablet by mouth daily., Disp: , Rfl:    Multiple Vitamin (MULTIVITAMIN) tablet, Take 1 tablet by mouth daily., Disp: , Rfl:    omeprazole (PRILOSEC) 10 MG capsule, Take 10 mg by mouth daily., Disp: , Rfl:    timolol (TIMOPTIC) 0.5 % ophthalmic solution, 1 drop every morning., Disp: , Rfl:    Garlic 400 MG TBEC, Take by mouth. (Patient not taking: Reported on 11/27/2023), Disp: , Rfl:   Review of Symptoms: Complete 10-system review is positive for: Improving constipation, urinary frequency, back pain, easy bruising/bleeding, bloating, stable numbness in feet, fatigue, vision problems, incontinence, back itching, gait problem, confusion  Physical Exam: BP (!) 144/70 (BP Location:  Left Arm, Patient Position: Sitting) Comment: Notified RN  Pulse 64   Temp 98.7 F (37.1 C) (Oral)   Resp 20   Wt 167 lb 6.4 oz (75.9 kg)   SpO2 98%   BMI 25.45 kg/m  General: Alert, oriented, no acute distress. HEENT: Normocephalic, atraumatic. Neck symmetric without masses. Sclera anicteric.  Chest: Normal work of breathing. Clear to auscultation bilaterally.   Cardiovascular: Regular rate and rhythm, no murmurs. Abdomen: Soft, nontender.  Normoactive bowel sounds.  No masses appreciated.   Extremities: Grossly normal range of motion.  Warm, well perfused.  No edema bilaterally. Skin: No rashes or lesions noted. Lymphatics: No cervical, supraclavicular, or inguinal adenopathy. GU: External genitalia with evidence of prior anterior vulvectomy and groin biopsy. Well healed scar. No lesions. Speculum exam with vagina without lesions. Bimanual exam reveals smooth vaginal cuff, no nodularity or pelvic mass. Exam chaperoned by Kimberly [place], CMA   Laboratory & Radiologic Studies: Surgical pathology (09/03/23): A. VULVAR BIOPSY:  - Fragment of granulation tissue, consistent with ulceration  - Viable mucosa is not identified  - No evidence of malignancy

## 2023-12-03 NOTE — Patient Instructions (Signed)
Plan on having a PET scan in April 2025 with follow up after with Dr. Alvester Morin. FOR THE PET SCAN, NOTHING TO EAT OR DRINK FOR 6 HOURS BEFORE. YOU CAN HAVE WATER.

## 2023-12-05 ENCOUNTER — Telehealth: Payer: Self-pay | Admitting: *Deleted

## 2023-12-05 NOTE — Telephone Encounter (Signed)
Per Dr Alvester Morin scheduled the patient for a PET scan on 3/27 and moved office visit from 4/27 to 4/14. Patient's daughter aware of the dates/times and instructions for the appts, along with location

## 2023-12-11 ENCOUNTER — Encounter: Payer: Self-pay | Admitting: Psychiatry

## 2024-03-17 ENCOUNTER — Ambulatory Visit: Payer: Medicare Other | Admitting: Psychiatry

## 2024-03-20 ENCOUNTER — Encounter (HOSPITAL_COMMUNITY)
Admission: RE | Admit: 2024-03-20 | Discharge: 2024-03-20 | Disposition: A | Discharge status: Home / Self Care | Payer: Medicare Other | Source: Ambulatory Visit | Attending: Gynecologic Oncology | Admitting: Gynecologic Oncology

## 2024-03-20 DIAGNOSIS — C519 Malignant neoplasm of vulva, unspecified: Secondary | ICD-10-CM | POA: Diagnosis not present

## 2024-03-20 DIAGNOSIS — I2510 Atherosclerotic heart disease of native coronary artery without angina pectoris: Secondary | ICD-10-CM | POA: Insufficient documentation

## 2024-03-20 MED ORDER — FLUDEOXYGLUCOSE F - 18 (FDG) INJECTION
7.9000 | Freq: Once | INTRAVENOUS | Status: AC | PRN
  Administered 2024-03-20: 7.9 via INTRAVENOUS

## 2024-03-24 DIAGNOSIS — H35033 Hypertensive retinopathy, bilateral: Secondary | ICD-10-CM | POA: Diagnosis not present

## 2024-04-07 ENCOUNTER — Encounter: Payer: Self-pay | Admitting: Psychiatry

## 2024-04-07 ENCOUNTER — Inpatient Hospital Stay: Payer: Medicare Other | Attending: Psychiatry | Admitting: Psychiatry

## 2024-04-07 VITALS — BP 146/80 | HR 68 | Temp 98.0°F | Resp 17 | Wt 166.2 lb

## 2024-04-07 DIAGNOSIS — Z9079 Acquired absence of other genital organ(s): Secondary | ICD-10-CM | POA: Insufficient documentation

## 2024-04-07 DIAGNOSIS — Z8544 Personal history of malignant neoplasm of other female genital organs: Secondary | ICD-10-CM | POA: Diagnosis not present

## 2024-04-07 DIAGNOSIS — C519 Malignant neoplasm of vulva, unspecified: Secondary | ICD-10-CM

## 2024-04-07 NOTE — Progress Notes (Signed)
 Gynecologic Oncology Return Clinic Visit  Date of Service: 04/07/2024 Referring Provider:  Casimir Cleaver, MD [address] [place],  [place] [address]  Assessment & Plan: Debra Olsen is a 88 y.o. 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  SCC of the vulva: - NED on exam today.  - Signs symptoms of recurrence reviewed. - Given risk factors that increase her risk for recurrence including positive LVSI, grade 3, and close margin, 62mo PET completed on 10/11/23 and NED. - Repeat PET 03/31/24 NED. - Given now 1 year NED, can space follow-up to q30mo > annually in year 3. Repeat PET in 1 year than may be able to discontinue.   Oropharyngeal lesion: - PET with mild asymmetric left oropharyngeal hypermetabolism with questionable asymmetric soft tissue prominence on CT.  - No overt abnormality on exam, possible hypertrophy of tonsil - Given that pt is already established with a dentist, recommend follow-up for further evaluation. Could refer to ENT if needed.   RTC 62mo.  Derrel Flies, MD Gynecologic Oncology  Medical Decision Making I personally spent  TOTAL 20 minutes face-to-face and non-face-to-face in the care of this patient, which includes all pre, intra, and post visit time on the date of service.   ----------------------- Reason for Visit: Surveillance  Treatment History: Oncology History  Vulvar cancer (HCC)  02/21/2023 Initial Biopsy   Vulvar biopsy: SCC with DOI 4mm   02/21/2023 Initial Diagnosis   Vulvar cancer (HCC)   04/05/2023 Surgery   Radical partial vulvectomy, bilateral inguinofemoral sentinel lymph node evaluation and biopsies   04/05/2023 Cancer Staging   Staging form: Vulva, AJCC V9 - Pathologic stage from 04/05/2023: FIGO Stage IB (pT1b, pN0(sn), cM0) -  Signed by Derrel Flies, MD on 04/26/2023 Histopathologic type: Squamous cell carcinoma, NOS Stage prefix: Initial diagnosis Method of lymph node assessment: Sentinel lymph node biopsy Histologic grade (G): G3 Histologic grading system: 3 grade system Lymph-vascular invasion (LVI): LVI present/identified, NOS P16 overexpression: Negative Differentiated vulvar intraepithelial neoplasia (dVIN): Present   04/05/2023 Pathology Results   Diagnosis   A. Lymph node, sentinel, right inguinal, excision - One lymph node with no metastatic carcinoma identified by H&E or pancytokeratin AE1/AE3 immunohistochemical stain (0/1)   B. Lymph node, sentinel, left inguinal, excision - One lymph node with no metastatic carcinoma identified by H&E or pancytokeratin AE1/AE3 immunohistochemical stain (0/1)   C. 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  Electronically signed by Sylvia Everts, MD on 04/10/2023 at  9:57 AM  Diagnosis Comment   Immunohistochemical stains are performed on C5 and show that the tumor is negative for p16 with apparent null pattern negative staining for p53, consistent with HPV independent squamous cell carcinoma. CD31 stain on block C8 shows apparent endothelial cells around an area of tumor, supporting the presence of focal lymphovascular space invasion.   Synoptic Report VULVA VULVA -  All Specimens 9th  Edition - Protocol posted: 12/06/2022  SPECIMEN    Procedure:    Partial vulvectomy  TUMOR    Tumor Focality:    Unifocal    Tumor Site:    Anterior midline vulva    Tumor Size:    Greatest Dimension (Centimeters): 0.6 cm    Histologic Type:    Squamous cell carcinoma, HPV-independent    Histologic Grade:    G3, poorly differentiated    Depth of Invasion (Millimeters) (FIGO 2021 method):    2 mm    Tumor Growth Pattern:    Partially pushing but focally infiltrative    Other Tissue / Organ Involvement:    Not applicable    Lymphatic and / or Vascular Invasion:    Present  MARGINS    Margin Status for Invasive Carcinoma:    All margins negative for invasive carcinoma      Closest Margin(s) to Invasive Carcinoma:    Peripheral: 3-6:00      Distance from Invasive Carcinoma to Closest Margin:    3 mm    Margin Status for Precursor Lesions of Squamous Cell Carcinoma and / or Paget Disease:    All margins negative for squamous precursor lesions  REGIONAL LYMPH NODES    Regional Lymph Node Status:          :    All regional lymph nodes negative for tumor cells      Total Number of Lymph Nodes Examined:    2        Nodal Site(s) Examined:    Right inguinal: sentinel        Nodal Site(s) Examined:    Left inguinal: sentinel      Number of Sentinel Nodes Examined:    2  pTNM CLASSIFICATION (AJCC Version 9)    Reporting of pT, pN, and (when applicable) pM categories is based on information available to the pathologist at the time the report is issued. As per the AJCC (Chapter 1, 8th Ed.) it is the managing physician's responsibility to establish the final pathologic stage based upon all pertinent information, including but potentially not limited to this pathology report.    pT Category:    pT1b    pN Category:    pN0    N Suffix:    (sn)  FIGO STAGE    FIGO Stage:    IB  ADDITIONAL FINDINGS    Additional Findings:    Differentiated vulvar intraepithelial neoplasia (dVIN)  SPECIAL  STUDIES    p16 Immunohistochemistry:    Negative    p53 Immunohistochemistry:    Abnormal      :    Absent / null     Comment(s):    See diagnosis comment       Interval History: Pt presents with her daughter. Reports that she has been doing yard work recently and her back has been hurting but is overall improving.  Still waiting on getting her lower dentures. Otherwise denies pain, vaginal/vulvar itching, bleeding. Denies change in bowel or bladder habits. No weight loss. Denies nausea/vomiting.    Past Medical/Surgical History: Past Medical History:  Diagnosis Date   Arthritis    Cancer (HCC)    Vulvar   Diverticulosis of intestine    GERD (gastroesophageal reflux disease)    Mixed hyperlipidemia    Osteopenia 04/05/2021   Prediabetes    TIA (transient ischemic attack)    Vitamin B12 deficiency     Past Surgical History:  Procedure  Laterality Date   ABDOMINAL HYSTERECTOMY     VULVECTOMY      Family History  Problem Relation Age of Onset   Kidney disease Mother    Heart failure Father    Heart disease Father    Breast cancer Sister    Alzheimer's disease Sister    Diabetes Brother    Cancer Brother        unknown   Heart disease Brother    Cancer Other    Ovarian cancer Neg Hx    Colon cancer Neg Hx    Uterine cancer Neg Hx     Social History   Socioeconomic History   Marital status: Widowed    Spouse name: Not on file   Number of children: 5   Years of education: Not on file   Highest education level: Not on file  Occupational History   Occupation: retired  Tobacco Use   Smoking status: Former    Types: Cigarettes   Smokeless tobacco: Never  Vaping Use   Vaping status: Never Used  Substance and Sexual Activity   Alcohol use: Yes    Comment: occ   Drug use: No   Sexual activity: Not Currently  Other Topics Concern   Not on file  Social History Narrative   Visits with daughter, Inetta Fermo a lot   Other children live out of state   Daughter in  [place] is a Librarian, academic and their POA   Social Drivers of Health   Financial Resource Strain: Low Risk  (02/12/2023)   Overall Financial Resource Strain (CARDIA)    Difficulty of Paying Living Expenses: Not hard at all  Food Insecurity: No Food Insecurity (03/01/2023)   Hunger Vital Sign    Worried About Running Out of Food in the Last Year: Never true    Ran Out of Food in the Last Year: Never true  Transportation Needs: No Transportation Needs (04/09/2023)   PRAPARE - Administrator, Civil Service (Medical): No    Lack of Transportation (Non-Medical): No  Physical Activity: Insufficiently Active (02/12/2023)   Exercise Vital Sign    Days of Exercise per Week: 3 days    Minutes of Exercise per Session: 30 min  Stress: No Stress Concern Present (02/12/2023)   [employer] of Occupational Health - Occupational Stress Questionnaire    Feeling of Stress : Not at all  Social Connections: Socially Isolated (02/12/2023)   Social Connection and Isolation Panel [NHANES]    Frequency of Communication with Friends and Family: More than three times a week    Frequency of Social Gatherings with Friends and Family: More than three times a week    Attends Religious Services: Never    Database administrator or Organizations: No    Attends Banker Meetings: Never    Marital Status: Widowed    Current Medications:  Current Outpatient Medications:    acetaminophen (TYLENOL) 500 MG tablet, Take 1,000 mg by mouth every 6 (six) hours as needed for mild pain., Disp: , Rfl:    latanoprost (XALATAN) 0.005 % ophthalmic solution, 1 drop at bedtime., Disp: , Rfl:    MAGNESIUM PO, Take 1 tablet by mouth daily., Disp: , Rfl:    Multiple Vitamin (MULTIVITAMIN) tablet, Take 1 tablet by mouth daily., Disp: , Rfl:    omeprazole (PRILOSEC) 10 MG capsule, Take 10 mg by mouth daily., Disp: , Rfl:    timolol (TIMOPTIC) 0.5 % ophthalmic solution, 1 drop every morning., Disp: ,  Rfl:   Review  of Symptoms: Complete 10-system review is positive for: Easy bruising, back pain  Physical Exam: BP (!) 153/73 (BP Location: Left Arm, Patient Position: Sitting) Comment: will inform nurse for 2nd BP  Pulse 68   Temp 98 F (36.7 C) (Oral)   Resp 17   Wt 166 lb 3.2 oz (75.4 kg)   SpO2 100%   BMI 25.27 kg/m  General: Alert, oriented, no acute distress. HEENT: Normocephalic, atraumatic. Neck symmetric without masses. Sclera anicteric. Possible hypertrophy of left tonsil but otherwise normal oropharyngeal exam Chest: Normal work of breathing. Clear to auscultation bilaterally.   Cardiovascular: Regular rate and rhythm, no murmurs. Abdomen: Soft, nontender.  Normoactive bowel sounds.  No masses appreciated.   Extremities: Grossly normal range of motion.  Warm, well perfused.  No edema bilaterally. Skin: No rashes or lesions noted. Lymphatics: No cervical, supraclavicular, or inguinal adenopathy. GU: External genitalia with evidence of prior anterior vulvectomy and groin biopsy. Well healed scar. No lesions. Speculum exam with vagina without lesions. Bimanual exam reveals smooth vaginal cuff, no nodularity or pelvic mass. Exam chaperoned by Kimberly [place], CMA    Laboratory & Radiologic Studies: NM PET Image Restage (PS) Skull Base to Thigh (F-18 FDG) 03/20/2024  Narrative CLINICAL DATA:  Subsequent treatment strategy for vulvar cancer.  EXAM: NUCLEAR MEDICINE PET SKULL BASE TO THIGH  TECHNIQUE: 7.9 mCi F-18 FDG was injected intravenously. Full-ring PET imaging was performed from the skull base to thigh after the radiotracer. CT data was obtained and used for attenuation correction and anatomic localization.  Fasting blood glucose: 134 mg/dl  COMPARISON:  16/09/9603.  FINDINGS: Mediastinal blood pool activity: SUV max 2.8  Liver activity: SUV max NA  NECK:  Mild asymmetric left oropharyngeal hypermetabolism, SUV max 4.9, with questionable asymmetric soft tissue  prominence on CT (202/21). Otherwise, no abnormal hypermetabolism.  Incidental CT findings:  None.  CHEST:  Mild brown fat hypermetabolism. Otherwise, no abnormal hypermetabolism.  Incidental CT findings:  Atherosclerotic calcification of the aorta, aortic valve and coronary arteries. Enlarged pulmonic trunk and heart. No pericardial or pleural effusion.  ABDOMEN/PELVIS:  No abnormal hypermetabolism.  Incidental CT findings:  Liver, gallbladder, adrenal glands, kidneys, spleen, pancreas, stomach and bowel are grossly unremarkable. Atherosclerotic calcification of the aorta.  SKELETON:  No abnormal hypermetabolism.  Incidental CT findings:  Degenerative changes in the spine.  Osteopenia.  IMPRESSION: 1. No evidence of recurrent or metastatic disease. 2. Mild asymmetric left oropharyngeal hypermetabolism with questionable asymmetric soft tissue prominence on CT. This should be amenable to direct visualization. 3. Aortic atherosclerosis (ICD10-I70.0). Coronary artery calcification. 4. Enlarged pulmonic trunk, indicative of pulmonary arterial hypertension.   Electronically Signed By: Shearon Denis M.D. On: 03/31/2024 14:09

## 2024-04-07 NOTE — Patient Instructions (Signed)
 It was a pleasure to see you in clinic today. - Normal exam today - Recommend follow-up with dentist to also examine throat - Return visit planned for 54mo  Thank you very much for allowing me to provide care for you today.  I appreciate your confidence in choosing our Gynecologic Oncology team at [facility].  If you have any questions about your visit today please call our office or send us  a MyChart message and we will get back to you as soon as possible.

## 2024-04-14 ENCOUNTER — Ambulatory Visit: Payer: Medicare Other | Admitting: Psychiatry

## 2024-04-14 DIAGNOSIS — M62830 Muscle spasm of back: Secondary | ICD-10-CM | POA: Diagnosis not present

## 2024-04-14 DIAGNOSIS — S29012A Strain of muscle and tendon of back wall of thorax, initial encounter: Secondary | ICD-10-CM | POA: Diagnosis not present

## 2024-04-23 DIAGNOSIS — M546 Pain in thoracic spine: Secondary | ICD-10-CM | POA: Diagnosis not present

## 2024-04-23 DIAGNOSIS — S22009S Unspecified fracture of unspecified thoracic vertebra, sequela: Secondary | ICD-10-CM | POA: Diagnosis not present

## 2024-04-23 DIAGNOSIS — N390 Urinary tract infection, site not specified: Secondary | ICD-10-CM | POA: Diagnosis not present

## 2024-04-23 DIAGNOSIS — S22000A Wedge compression fracture of unspecified thoracic vertebra, initial encounter for closed fracture: Secondary | ICD-10-CM | POA: Diagnosis not present

## 2024-04-24 ENCOUNTER — Ambulatory Visit: Admitting: Orthopaedic Surgery

## 2024-04-24 ENCOUNTER — Encounter: Payer: Self-pay | Admitting: Orthopaedic Surgery

## 2024-04-24 ENCOUNTER — Other Ambulatory Visit: Payer: Self-pay

## 2024-04-24 DIAGNOSIS — M549 Dorsalgia, unspecified: Secondary | ICD-10-CM | POA: Diagnosis not present

## 2024-04-24 MED ORDER — TRAMADOL HCL 50 MG PO TABS: 50.0000 mg | ORAL_TABLET | Freq: Four times a day (QID) | ORAL | 0 refills | Status: AC | PRN

## 2024-04-24 NOTE — Progress Notes (Signed)
 Subjective:    Patient ID: Debra Olsen, female    DOB: [birth year], 88 y.o.   MRN: [id number]  HPI She was weed eating her yard last week and began to have pain in the mid back to lower back.  She has a history of fracture of L1 and T10 last year.  She was seen by Dr. Dama Duffel for this 08-22-23.  He gave her a brace which she did not like.  She went to Urgent Care because her back was hurting.  They obtained X-rays and told to to take ibuprofen  and see Dr. Dama Duffel.  She did not want to go to [facility] and came here today.  She has no weakness, no numbness.  Her lower back hurts but not as much as a few days ago.  She is active.   Review of Systems  Constitutional:        History of prior cancer.  Post recent PET Scans.  Musculoskeletal:  Positive for arthralgias, back pain and myalgias.  All other systems reviewed and are negative. For Review of Systems, all other systems reviewed and are negative.  The following is a summary of the past history medically, past history surgically, known current medicines, social history and family history.  This information is gathered electronically by the computer from prior information and documentation.  I review this each visit and have found including this information at this point in the chart is beneficial and informative.   Past Medical History:  Diagnosis Date   Arthritis    Cancer (HCC)    Vulvar   Diverticulosis of intestine    GERD (gastroesophageal reflux disease)    Mixed hyperlipidemia    Osteopenia 04/05/2021   Prediabetes    TIA (transient ischemic attack)    Vitamin B12 deficiency     Past Surgical History:  Procedure Laterality Date   ABDOMINAL HYSTERECTOMY     VULVECTOMY      Current Outpatient Medications on File Prior to Visit  Medication Sig Dispense Refill   acetaminophen  (TYLENOL ) 500 MG tablet Take 1,000 mg by mouth every 6 (six) hours as needed for mild pain.     cephALEXin  (KEFLEX ) 500 MG capsule Take 500  mg by mouth 2 (two) times daily.     latanoprost (XALATAN) 0.005 % ophthalmic solution 1 drop at bedtime.     MAGNESIUM PO Take 1 tablet by mouth daily.     Multiple Vitamin (MULTIVITAMIN) tablet Take 1 tablet by mouth daily.     omeprazole (PRILOSEC) 10 MG capsule Take 10 mg by mouth daily.     timolol (TIMOPTIC) 0.5 % ophthalmic solution 1 drop every morning.     methocarbamol (ROBAXIN) 500 MG tablet Take 500 mg by mouth at bedtime as needed. (Patient not taking: Reported on 04/24/2024)     No current facility-administered medications on file prior to visit.    Social History   Socioeconomic History   Marital status: Widowed    Spouse name: Not on file   Number of children: 5   Years of education: Not on file   Highest education level: Not on file  Occupational History   Occupation: retired  Tobacco Use   Smoking status: Former    Types: Cigarettes   Smokeless tobacco: Never  Vaping Use   Vaping status: Never Used  Substance and Sexual Activity   Alcohol  use: Yes    Comment: occ   Drug use: No   Sexual activity: Not Currently  Other Topics Concern  Not on file  Social History Narrative   Visits with daughter, Brian Campanile a lot   Other children live out of state   Daughter in [place]  is a doctor and their POA   Social Drivers of Health   Financial Resource Strain: Low Risk  (02/12/2023)   Overall Financial Resource Strain (CARDIA)    Difficulty of Paying Living Expenses: Not hard at all  Food Insecurity: No Food Insecurity (03/01/2023)   Hunger Vital Sign    Worried About Running Out of Food in the Last Year: Never true    Ran Out of Food in the Last Year: Never true  Transportation Needs: No Transportation Needs (04/09/2023)   PRAPARE - Administrator, Civil Service (Medical): No    Lack of Transportation (Non-Medical): No  Physical Activity: Insufficiently Active (02/12/2023)   Exercise Vital Sign    Days of Exercise per Week: 3 days    Minutes of Exercise  per Session: 30 min  Stress: No Stress Concern Present (02/12/2023)   [employer] of Occupational Health - Occupational Stress Questionnaire    Feeling of Stress : Not at all  Social Connections: Socially Isolated (02/12/2023)   Social Connection and Isolation Panel [NHANES]    Frequency of Communication with Friends and Family: More than three times a week    Frequency of Social Gatherings with Friends and Family: More than three times a week    Attends Religious Services: Never    Database administrator or Organizations: No    Attends Banker Meetings: Never    Marital Status: Widowed  Intimate Partner Violence: Not At Risk (03/01/2023)   Humiliation, Afraid, Rape, and Kick questionnaire    Fear of Current or Ex-Partner: No    Emotionally Abused: No    Physically Abused: No    Sexually Abused: No    Family History  Problem Relation Age of Onset   Kidney disease Mother    Heart failure Father    Heart disease Father    Breast cancer Sister    Alzheimer's disease Sister    Diabetes Brother    Cancer Brother        unknown   Heart disease Brother    Cancer Other    Ovarian cancer Neg Hx    Colon cancer Neg Hx    Uterine cancer Neg Hx     There were no vitals taken for this visit.  There is no height or weight on file to calculate BMI.      Objective:   Physical Exam Vitals and nursing note reviewed. Exam conducted with a chaperone present.  Constitutional:      Appearance: She is well-developed.  HENT:     Head: Normocephalic and atraumatic.  Eyes:     Conjunctiva/sclera: Conjunctivae normal.     Pupils: Pupils are equal, round, and reactive to light.  Cardiovascular:     Rate and Rhythm: Normal rate and regular rhythm.  Pulmonary:     Effort: Pulmonary effort is normal.  Abdominal:     Palpations: Abdomen is soft.  Musculoskeletal:       Arms:     Cervical back: Normal range of motion and neck supple.  Skin:    General: Skin is warm and  dry.  Neurological:     Mental Status: She is alert and oriented to person, place, and time.     Cranial Nerves: No cranial nerve deficit.     Motor: No abnormal muscle  tone.     Coordination: Coordination normal.     Deep Tendon Reflexes: Reflexes are normal and symmetric. Reflexes normal.  Psychiatric:        Behavior: Behavior normal.        Thought Content: Thought content normal.        Judgment: Judgment normal.   X-rays were done of the thoracolumbar spine, reported separately.  I compared X-rays done in August, 2024.  I have independently reviewed and interpreted x-rays of this patient done at another site by another physician or qualified health professional.  She has scoliosis of the lower back apex at L3.  Old compression fracture of L1 about 50 % and compression fracture of T10 about 70% which also appears old.  Diffuse degenerative changes of the lumbar spine.  I cannot appreciate a new fracture but would need scanning to ascertain this.  Bone quality is osteoporotic.         Assessment & Plan:   Encounter Diagnosis  Name Primary?   Mid back pain Yes   She has old compression fractures of the lower thoracic and upper lumbar spine aggravated in my opinion by here activities.  She admits she has less pain than earlier in the week.  I do not think she needs to go back into the brace. She should continue to slowly improve.  I will call in Toradol  to her drug store to take if needed.  Continue the ibuprofen .  Reduce her yard work activities.  Return in one week.  If doing well, call and cancel.  Call if any problem.  Precautions discussed.  Electronically Signed Pleasant Brilliant, MD 5/1/202511:10 AM

## 2024-05-01 ENCOUNTER — Ambulatory Visit: Admitting: Orthopaedic Surgery

## 2024-05-13 DIAGNOSIS — H35363 Drusen (degenerative) of macula, bilateral: Secondary | ICD-10-CM | POA: Diagnosis not present

## 2024-05-13 DIAGNOSIS — H26492 Other secondary cataract, left eye: Secondary | ICD-10-CM | POA: Diagnosis not present

## 2024-05-13 DIAGNOSIS — H04123 Dry eye syndrome of bilateral lacrimal glands: Secondary | ICD-10-CM | POA: Diagnosis not present

## 2024-05-13 DIAGNOSIS — H401230 Low-tension glaucoma, bilateral, stage unspecified: Secondary | ICD-10-CM | POA: Diagnosis not present

## 2024-05-15 DIAGNOSIS — N390 Urinary tract infection, site not specified: Secondary | ICD-10-CM | POA: Diagnosis not present

## 2024-05-15 DIAGNOSIS — M549 Dorsalgia, unspecified: Secondary | ICD-10-CM | POA: Diagnosis not present

## 2024-05-15 DIAGNOSIS — R82998 Other abnormal findings in urine: Secondary | ICD-10-CM | POA: Diagnosis not present

## 2024-05-19 ENCOUNTER — Encounter (HOSPITAL_COMMUNITY): Payer: Self-pay | Admitting: *Deleted

## 2024-05-19 ENCOUNTER — Emergency Department (HOSPITAL_COMMUNITY)
Admission: EM | Admit: 2024-05-19 | Discharge: 2024-05-19 | Disposition: A | Discharge status: Home / Self Care | Attending: Emergency Medicine | Admitting: Emergency Medicine

## 2024-05-19 ENCOUNTER — Other Ambulatory Visit: Payer: Self-pay

## 2024-05-19 ENCOUNTER — Emergency Department (HOSPITAL_COMMUNITY)

## 2024-05-19 DIAGNOSIS — R29890 Loss of height: Secondary | ICD-10-CM | POA: Diagnosis not present

## 2024-05-19 DIAGNOSIS — M546 Pain in thoracic spine: Secondary | ICD-10-CM | POA: Insufficient documentation

## 2024-05-19 DIAGNOSIS — M4856XA Collapsed vertebra, not elsewhere classified, lumbar region, initial encounter for fracture: Secondary | ICD-10-CM | POA: Diagnosis not present

## 2024-05-19 DIAGNOSIS — M545 Low back pain, unspecified: Secondary | ICD-10-CM | POA: Insufficient documentation

## 2024-05-19 DIAGNOSIS — M51369 Other intervertebral disc degeneration, lumbar region without mention of lumbar back pain or lower extremity pain: Secondary | ICD-10-CM | POA: Diagnosis not present

## 2024-05-19 DIAGNOSIS — N390 Urinary tract infection, site not specified: Secondary | ICD-10-CM | POA: Diagnosis not present

## 2024-05-19 DIAGNOSIS — M4135 Thoracogenic scoliosis, thoracolumbar region: Secondary | ICD-10-CM | POA: Diagnosis not present

## 2024-05-19 DIAGNOSIS — M4854XA Collapsed vertebra, not elsewhere classified, thoracic region, initial encounter for fracture: Secondary | ICD-10-CM | POA: Diagnosis not present

## 2024-05-19 DIAGNOSIS — M419 Scoliosis, unspecified: Secondary | ICD-10-CM | POA: Diagnosis not present

## 2024-05-19 MED ORDER — TRAMADOL HCL 50 MG PO TABS: 50.0000 mg | ORAL_TABLET | Freq: Four times a day (QID) | ORAL | 0 refills | Status: AC | PRN

## 2024-05-19 MED ORDER — TRAMADOL HCL 50 MG PO TABS
50.0000 mg | ORAL_TABLET | Freq: Once | ORAL | Status: AC
  Administered 2024-05-19: 50 mg via ORAL
  Filled 2024-05-19: qty 1

## 2024-05-19 NOTE — ED Triage Notes (Signed)
 Pt with mid back pain across for a week. Hx of compression fx in the past. Pt currently on antibiotics for UTI.

## 2024-05-19 NOTE — Discharge Instructions (Addendum)
 Today you were seen for back pain.  Please pick up your pain medication and take as prescribed.  Please follow-up with Dr. Iline Mallory as soon as possible for further evaluation and workup.  Please return to the ED if you have loss of control of your bowel or bladder, fever that does not go down with Tylenol  or Motrin , or numbness or tingling.  Thank you for letting us  treat you today. After reviewing your labs imaging, I feel you are safe to go home. Please follow up with your PCP in the next several days and provide them with your records from this visit. Return to the Emergency Room if pain becomes severe or symptoms worsen.

## 2024-05-19 NOTE — ED Provider Notes (Signed)
 [place] EMERGENCY DEPARTMENT AT [facility] Provider Note   CSN: [id number] Arrival date & time: 05/19/24  1744     History  Chief Complaint  Patient presents with   Back Pain    Debra Olsen is a 88 y.o. female presents today for mid back pain x 1 week.  Patient has a history of a compression fracture in the past.  Patient currently taking antibiotics for UTI.  Patient was seen at orthopedics at the beginning of this month for similar.  Patient denies injury, numbness, weakness, saddle anesthesia, fever, chills, loss of bowel or bladder control, any other complaints at this time.  HPI     Home Medications Prior to Admission medications   Medication Sig Start Date End Date Taking? Authorizing Provider  acetaminophen  (TYLENOL ) 500 MG tablet Take 1,000 mg by mouth every 6 (six) hours as needed for mild pain.    [provider]  Cephalexin  500 MG tablet Take 500 mg by mouth every 8 (eight) hours. 05/15/24   [provider]  latanoprost (XALATAN) 0.005 % ophthalmic solution 1 drop at bedtime. 01/06/22   [provider]  MAGNESIUM PO Take 1 tablet by mouth daily.    [provider]  methocarbamol (ROBAXIN) 500 MG tablet Take 500 mg by mouth at bedtime as needed. Patient not taking: Reported on 04/24/2024 04/14/24   [provider]  Multiple Vitamin (MULTIVITAMIN) tablet Take 1 tablet by mouth daily.    [provider]  omeprazole (PRILOSEC) 10 MG capsule Take 10 mg by mouth daily.    [provider]  timolol (TIMOPTIC) 0.5 % ophthalmic solution 1 drop every morning. 03/19/23   [provider]  traMADol  (ULTRAM ) 50 MG tablet Take 1 tablet (50 mg total) by mouth every 6 (six) hours as needed for up to 5 days. 05/19/24 05/24/24 Yes Asherah Lavoy N, PA-C      Allergies    Lidocaine  and Penicillins    Review of Systems   Review of Systems  Physical Exam Updated Vital Signs BP (!) 157/115 (BP Location: Right  Arm)   Pulse 80   Temp 97.7 F (36.5 C) (Oral)   Resp 16   Ht 5\' 8"  (1.727 m)   Wt 72.6 kg   SpO2 99%   BMI 24.33 kg/m  Physical Exam Vitals and nursing note reviewed.  Constitutional:      General: She is not in acute distress.    Appearance: Normal appearance. She is well-developed. She is not ill-appearing, toxic-appearing or diaphoretic.  HENT:     Head: Normocephalic and atraumatic.     Right Ear: External ear normal.     Left Ear: External ear normal.     Mouth/Throat:     Mouth: Mucous membranes are moist.  Eyes:     Conjunctiva/sclera: Conjunctivae normal.  Cardiovascular:     Rate and Rhythm: Normal rate and regular rhythm.     Pulses: Normal pulses.  Pulmonary:     Effort: Pulmonary effort is normal. No respiratory distress.     Breath sounds: Normal breath sounds.  Abdominal:     Palpations: Abdomen is soft.     Tenderness: There is no abdominal tenderness.  Musculoskeletal:        General: No swelling.     Cervical back: Normal range of motion and neck supple.     Comments: Patient able to straight leg lift bilateral legs without issue.  Patient has mild tenderness to palpation of the paraspinal  muscles in the low T/high L-spine.  No ecchymosis, step-offs, deformity, wound, or erythema noted.  Skin:    General: Skin is warm and dry.     Capillary Refill: Capillary refill takes less than 2 seconds.  Neurological:     General: No focal deficit present.     Mental Status: She is alert.     Sensory: No sensory deficit.     Gait: Gait normal.  Psychiatric:        Mood and Affect: Mood normal.     ED Results / Procedures / Treatments   Labs (all labs ordered are listed, but only abnormal results are displayed) Labs Reviewed - No data to display  EKG None  Radiology DG Lumbar Spine Complete Result Date: 05/19/2024 CLINICAL DATA:  Back pain. EXAM: LUMBAR SPINE - COMPLETE 4+ VIEW COMPARISON:  Lumbar spine CT 07/21/2023 FINDINGS: Five non-rib-bearing  lumbar vertebra. The bones are subjectively under mineralized. L1 compression fracture is chronic but progressed from prior exam, approximately 40% loss of height. No additional compression deformity. Slight exaggerated lumbar lordosis. Dextroscoliotic curvature at the thoracolumbar junction. Degenerative disc disease at L2-L3 and L4-L5. Moderate multilevel facet hypertrophy. Bilateral hip degenerative changes partially included in the field of view. IMPRESSION: 1. Chronic L1 compression fracture, however progressive loss of height from prior exam. 2. Multilevel degenerative disc disease and facet hypertrophy. 3. Dextroscoliotic curvature at the thoracolumbar junction. Electronically Signed   By: Chadwick Colonel M.D.   On: 05/19/2024 18:40   DG Thoracic Spine 2 View Result Date: 05/19/2024 CLINICAL DATA:  Mid back pain. EXAM: THORACIC SPINE 2 VIEWS COMPARISON:  Radiograph 08/22/2023 FINDINGS: T10 compression fracture is chronic but progressed from prior exam, approximately 65% loss of height centrally. Adjacent T9 compression fracture is new from prior, approximately 30% loss of height. The bones are subjectively under mineralized. Dextroscoliotic curvature of the thoracolumbar junction. Exaggerated thoracic kyphosis. No definite paravertebral soft tissue abnormality. IMPRESSION: 1. New T9 compression fracture, approximately 30% loss of height. 2. Chronic T10 compression fracture, however progressed from prior exam, approximately 65% loss of height centrally. 3. Dextroscoliotic curvature of the thoracolumbar junction with exaggerated thoracic kyphosis. Electronically Signed   By: Chadwick Colonel M.D.   On: 05/19/2024 18:39    Procedures Procedures    Medications Ordered in ED Medications - No data to display  ED Course/ Medical Decision Making/ A&P                                 Medical Decision Making  This patient presents to the ED for concern of back pain differential diagnosis includes  musculoskeletal pain, sciatica, spinal cord injury, cauda equina syndrome, epidural abscess  Additional history obtained   Additional history obtained from Electronic Medical Record External records from outside source obtained and reviewed including orthopedic office visit note   Imaging Studies ordered:  I ordered imaging studies including thoracic and lumbar spine x-rays I independently visualized and interpreted imaging which showed new T9 compression fracture approximately 30% loss of height.  Chronic T10 compression fracture, progressed to approximately 65% loss of height.  L1 progressive loss of height from prior exam. I agree with the radiologist interpretation   Problem List / ED Course:  Considered for admission or further workup however patient's vital signs, physical exam, and imaging have been reassuring.  Patient given pain control outpatient and advised to follow-up with Dr. Iline Mallory of orthopedics for further evaluation and  workup.  Patient given return precautions.  I feel patient is safe for discharge at this time         Final Clinical Impression(s) / ED Diagnoses Final diagnoses:  Thoracic back pain, unspecified back pain laterality, unspecified chronicity    Rx / DC Orders ED Discharge Orders          Ordered    traMADol  (ULTRAM ) 50 MG tablet  Every 6 hours PRN        05/19/24 1859              Carie Charity, PA-C 05/19/24 1900    Cheyenne Cotta, MD 05/21/24 [phone]

## 2024-05-26 ENCOUNTER — Telehealth: Payer: Self-pay | Admitting: Orthopaedic Surgery

## 2024-05-26 NOTE — Telephone Encounter (Signed)
 Dr. Iline Mallory' s pt Debra Olsen - the pt's daughter Clarke Crouch lvm on 5/30 after 12pm stating that the Dr. Linnell Richardson had given the pt muscle relaxers and Tramadol , which seem to be working, but she went to the ED recently for her back and the pain will not let up.  She ould like to make an appointment.  I tried calling, lvm for her to cb.

## 2024-05-28 DIAGNOSIS — S32010D Wedge compression fracture of first lumbar vertebra, subsequent encounter for fracture with routine healing: Secondary | ICD-10-CM | POA: Insufficient documentation

## 2024-05-29 ENCOUNTER — Ambulatory Visit (INDEPENDENT_AMBULATORY_CARE_PROVIDER_SITE_OTHER): Admitting: Orthopaedic Surgery

## 2024-05-29 ENCOUNTER — Encounter: Payer: Self-pay | Admitting: Orthopaedic Surgery

## 2024-05-29 VITALS — BP 146/84 | HR 64 | Ht 68.0 in | Wt 155.0 lb

## 2024-05-29 DIAGNOSIS — M8000XA Age-related osteoporosis with current pathological fracture, unspecified site, initial encounter for fracture: Secondary | ICD-10-CM | POA: Diagnosis not present

## 2024-05-29 DIAGNOSIS — S22000A Wedge compression fracture of unspecified thoracic vertebra, initial encounter for closed fracture: Secondary | ICD-10-CM

## 2024-05-29 DIAGNOSIS — M549 Dorsalgia, unspecified: Secondary | ICD-10-CM | POA: Diagnosis not present

## 2024-05-29 MED ORDER — TRAMADOL HCL 50 MG PO TABS: 50.0000 mg | ORAL_TABLET | Freq: Four times a day (QID) | ORAL | 4 refills | Status: AC | PRN

## 2024-05-29 NOTE — Progress Notes (Signed)
 My back hurts.  She had increased pain to the mid lower back.  She had no fall.  She went to the ER on 05-19-24.  X-rays showed a new compression fracture of T9, 30% compression with further compression of T10 to over 65%.  She has a brace but does not like to wear it.  Tramadol  controls her pain.  She has pain in the mid to lower back.  ROM is limited secondary to pain.  NV intact.  No spasm.  Gait is slow but good.  Muscle tone and strength are normal.  Encounter Diagnoses  Name Primary?   Mid back pain Yes   Compression fracture of body of thoracic vertebra (HCC)    Age-related osteoporosis with current pathological fracture, initial encounter    I will have her go to the osteoporosis clinic at the main [facility] office.  I will give Tramadol  for pain relief.  I have explained how this could have happened.  We discussed diet changes as well.  I have independently reviewed and interpreted x-rays of this patient done at another site by another physician or qualified health professional.  I have reviewed the ER notes.  Call if any problem.  Precautions discussed.  Electronically Signed Pleasant Brilliant, MD 6/5/20258:53 AM

## 2024-06-23 ENCOUNTER — Ambulatory Visit (INDEPENDENT_AMBULATORY_CARE_PROVIDER_SITE_OTHER): Admitting: Physician Assistant

## 2024-06-23 ENCOUNTER — Other Ambulatory Visit: Payer: Self-pay | Admitting: Physician Assistant

## 2024-06-23 DIAGNOSIS — Z09 Encounter for follow-up examination after completed treatment for conditions other than malignant neoplasm: Secondary | ICD-10-CM | POA: Diagnosis not present

## 2024-06-23 DIAGNOSIS — M810 Age-related osteoporosis without current pathological fracture: Secondary | ICD-10-CM | POA: Insufficient documentation

## 2024-06-23 DIAGNOSIS — Z9189 Other specified personal risk factors, not elsewhere classified: Secondary | ICD-10-CM | POA: Diagnosis not present

## 2024-06-23 NOTE — Addendum Note (Signed)
 Addended by: RODGERS LACY on: 06/23/2024 10:49 AM   Modules accepted: Orders

## 2024-06-23 NOTE — Progress Notes (Signed)
 Office Visit Note   Patient: Debra Olsen           Date of Birth: Jun 04, 1936           MRN: [id number] Visit Date: 06/23/2024              Requested by: Brenna Lin, MD [address] [place],  [place] [address] PCP: Eldonna Mays, MD   Assessment & Plan: Visit Diagnoses:  1. Age-related osteoporosis without current pathological fracture     Plan: Patient is a pleasant 88 year old woman who was referred from Dr. Brenna.  She comes in today to discuss osteoporosis.  She has no recent labs and has not had a bone density scan done.  She does have a history of a T10 and L1 compression fractures for which she is wearing a brace today.  She does have a history of melanoma.  No history of heart disease or kidney disease no history of epilepsy.  She did have hysterectomy when she was in her 30s and went through menopause did not have any hormone replacement therapy.  She does not smoke or drink.  She does walking for exercise.  45 minutes was spent reviewing her chart and speaking with her and her daughter.  I do have a couple concerns.  She is has not seen a primary care physician in 4 years.  She does need to be referred to primary care.  Will take appropriate labs today since she has not had any lab work done in a while.  Will also do a bone density scan.  Based on her labs and her other health she may be a candidate for Prolia.  We also talked about the side effects and treatment with this drug.  She is by definition osteoporotic.  Will follow-up with her once these are completed also discussed vitamin D supplementation if needed as well as calcium  needs  Follow-Up Instructions: After bone density scan  Orders:  No orders of the defined types were placed in this encounter.  No orders of the defined types were placed in this encounter.     Procedures: No procedures performed   Clinical Data: No additional findings.   Subjective: No chief complaint on file.   HPI  patient is a pleasant 88 year old woman who comes in today for discussion of osteoporosis referred by Dr. Brenna.  She has had multiple spine fractures  Review of Systems  All other systems reviewed and are negative.    Objective: Vital Signs: There were no vitals taken for this visit.  Physical Exam Constitutional:      Appearance: Normal appearance.   Skin:    General: Skin is warm and dry.   Neurological:     General: No focal deficit present.     Mental Status: She is alert and oriented to person, place, and time.   Psychiatric:        Mood and Affect: Mood normal.        Behavior: Behavior normal.       Specialty Comments:  No specialty comments available.  Imaging: No results found.   PMFS History: Patient Active Problem List   Diagnosis Date Noted   Age-related osteoporosis without current pathological fracture 06/23/2024   Compression fracture of L1 vertebra with routine healing 05/28/2024   Vulvar cancer (HCC) 04/26/2023   Caregiver stress 01/26/2022   Chronic pain of right knee 01/26/2022   Hemorrhoids, external 01/26/2022   Osteopenia 04/05/2021   Prediabetes 08/30/2020  Mixed hyperlipidemia    History of transient ischemic attack (TIA) 02/29/2020   Recurrent UTI 02/29/2020   B12 deficiency 02/29/2020   Past Medical History:  Diagnosis Date   Arthritis    Cancer (HCC)    Vulvar   Diverticulosis of intestine    GERD (gastroesophageal reflux disease)    Mixed hyperlipidemia    Osteopenia 04/05/2021   Prediabetes    TIA (transient ischemic attack)    Vitamin B12 deficiency     Family History  Problem Relation Age of Onset   Kidney disease Mother    Heart failure Father    Heart disease Father    Breast cancer Sister    Alzheimer's disease Sister    Diabetes Brother    Cancer Brother        unknown   Heart disease Brother    Cancer Other    Ovarian cancer Neg Hx    Colon cancer Neg Hx    Uterine cancer Neg Hx     Past Surgical  History:  Procedure Laterality Date   ABDOMINAL HYSTERECTOMY     VULVECTOMY     Social History   Occupational History   Occupation: retired  Tobacco Use   Smoking status: Former    Types: Cigarettes   Smokeless tobacco: Never  Vaping Use   Vaping status: Never Used  Substance and Sexual Activity   Alcohol  use: Yes    Comment: occ   Drug use: No   Sexual activity: Not Currently

## 2024-06-25 ENCOUNTER — Encounter: Admitting: Physician Assistant

## 2024-06-25 LAB — EXTRA SPECIMEN

## 2024-06-25 LAB — VITAMIN D 25 HYDROXY (VIT D DEFICIENCY, FRACTURES): Vit D, 25-Hydroxy: 76 ng/mL (ref 30–100)

## 2024-06-25 LAB — EXTRA LAV TOP TUBE

## 2024-06-25 LAB — HOUSE ACCOUNT TRACKING

## 2024-06-30 ENCOUNTER — Telehealth: Payer: Self-pay

## 2024-06-30 ENCOUNTER — Telehealth: Payer: Self-pay | Admitting: Physician Assistant

## 2024-06-30 DIAGNOSIS — M810 Age-related osteoporosis without current pathological fracture: Secondary | ICD-10-CM

## 2024-06-30 NOTE — Telephone Encounter (Signed)
 Pt's daughter called stating Ronal Caldron was sending a referral to [facility] for Bone Scan. Asking for call back to Christine at [phone].

## 2024-07-11 ENCOUNTER — Telehealth: Payer: Self-pay | Admitting: Physician Assistant

## 2024-07-11 ENCOUNTER — Ambulatory Visit (HOSPITAL_COMMUNITY)
Admission: RE | Admit: 2024-07-11 | Discharge: 2024-07-11 | Disposition: A | Discharge status: Home / Self Care | Source: Ambulatory Visit | Attending: Physician Assistant | Admitting: Physician Assistant

## 2024-07-11 DIAGNOSIS — M810 Age-related osteoporosis without current pathological fracture: Secondary | ICD-10-CM | POA: Insufficient documentation

## 2024-07-11 DIAGNOSIS — Z780 Asymptomatic menopausal state: Secondary | ICD-10-CM | POA: Diagnosis not present

## 2024-07-11 NOTE — Telephone Encounter (Signed)
 Results from bone scan that was done on 07/11/24, pt request a call

## 2024-08-19 ENCOUNTER — Telehealth: Payer: Self-pay | Admitting: *Deleted

## 2024-08-19 NOTE — Telephone Encounter (Signed)
 Per provider moved appt from 10/13 to 10/20. Patient aware

## 2024-08-27 ENCOUNTER — Ambulatory Visit (INDEPENDENT_AMBULATORY_CARE_PROVIDER_SITE_OTHER): Admitting: Physician Assistant

## 2024-08-27 ENCOUNTER — Encounter: Payer: Self-pay | Admitting: Physician Assistant

## 2024-08-27 DIAGNOSIS — R5383 Other fatigue: Secondary | ICD-10-CM

## 2024-08-27 DIAGNOSIS — M810 Age-related osteoporosis without current pathological fracture: Secondary | ICD-10-CM

## 2024-08-27 DIAGNOSIS — H401221 Low-tension glaucoma, left eye, mild stage: Secondary | ICD-10-CM | POA: Diagnosis not present

## 2024-08-27 NOTE — Progress Notes (Signed)
 Office Visit Note   Patient: Debra Olsen           Date of Birth: 12/08/1936           MRN: [id number] Visit Date: 08/27/2024              Requested by: Eldonna Mays, MD [address] [place],  [place] [address] PCP: Eldonna Mays, MD  No chief complaint on file.     HPI: Berwyn comes in today with her daughter for renewal of her bone density scan.  We did refer her to primary care but she does not want to come to [facility].  Assessment & Plan: Visit Diagnoses:  1. Age-related osteoporosis without current pathological fracture     Plan: Her bone density scan revealed a T-score of -2.9.  She also has had a history of compression fractures which is why she was referred by Dr. Brenna.  Based on this she is by definition osteoporotic.  She had high risk for fracture.  I reviewed this with her and her daughter spent time going over the medications specifically Prolia as well as the side effects which I discussed with him at her previous visit.  We will try and get her some referrals to primary care nearer to her home in [place].  Again I emphasized the importance of doing this.  We will redraw her blood work today but we will plan on authorizing her for Prolia  Follow-Up Instructions: Return if symptoms worsen or fail to improve.   Ortho Exam  Patient is alert, oriented, no adenopathy, well-dressed, normal affect, normal respiratory effort.    Imaging: No results found. No images are attached to the encounter.  Labs: Lab Results  Component Value Date   HGBA1C 5.7 (H) 07/26/2022   HGBA1C 5.7 (H) 01/26/2022   HGBA1C 5.7 07/26/2021   REPTSTATUS 03/02/2020 FINAL 02/29/2020   CULT >=100,000 COLONIES/mL ESCHERICHIA COLI (A) 02/29/2020   LABORGA ESCHERICHIA COLI (A) 02/29/2020     Lab Results  Component Value Date   ALBUMIN 4.2 10/24/2022   ALBUMIN 4.1 07/26/2022   ALBUMIN 4.2 01/26/2022    Lab Results  Component Value Date   MG 2.3 08/25/2020   Lab  Results  Component Value Date   VD25OH 76 06/23/2024    No results found for: PREALBUMIN    Latest Ref Rng & Units 07/21/2023    6:04 AM 07/26/2022   10:52 AM 01/26/2022   10:11 AM  CBC EXTENDED  WBC 4.0 - 10.5 K/uL 8.5  6.7  5.7   RBC 3.87 - 5.11 MIL/uL 4.27  4.66  4.59   Hemoglobin 12.0 - 15.0 g/dL 86.5  85.8  85.9   HCT 36.0 - 46.0 % 40.5  42.7  42.5   Platelets 150 - 400 K/uL 283  302  299   NEUT# 1.7 - 7.7 K/uL 5.3  3.8  3.2   Lymph# 0.7 - 4.0 K/uL 1.7  1.9  1.5      There is no height or weight on file to calculate BMI.  Orders:  No orders of the defined types were placed in this encounter.  No orders of the defined types were placed in this encounter.    Procedures: No procedures performed  Clinical Data: No additional findings.  ROS:  All other systems negative, except as noted in the HPI. Review of Systems  Objective: Vital Signs: There were no vitals taken for this visit.  Specialty Comments:  No specialty comments available.  PMFS History: Patient Active Problem List   Diagnosis Date Noted   Age-related osteoporosis without current pathological fracture 06/23/2024   Compression fracture of L1 vertebra with routine healing 05/28/2024   Vulvar cancer (HCC) 04/26/2023   Caregiver stress 01/26/2022   Chronic pain of right knee 01/26/2022   Hemorrhoids, external 01/26/2022   Osteopenia 04/05/2021   Prediabetes 08/30/2020   Mixed hyperlipidemia    History of transient ischemic attack (TIA) 02/29/2020   Recurrent UTI 02/29/2020   B12 deficiency 02/29/2020   Past Medical History:  Diagnosis Date   Arthritis    Cancer (HCC)    Vulvar   Diverticulosis of intestine    GERD (gastroesophageal reflux disease)    Mixed hyperlipidemia    Osteopenia 04/05/2021   Prediabetes    TIA (transient ischemic attack)    Vitamin B12 deficiency     Family History  Problem Relation Age of Onset   Kidney disease Mother    Heart failure Father    Heart disease  Father    Breast cancer Sister    Alzheimer's disease Sister    Diabetes Brother    Cancer Brother        unknown   Heart disease Brother    Cancer Other    Ovarian cancer Neg Hx    Colon cancer Neg Hx    Uterine cancer Neg Hx     Past Surgical History:  Procedure Laterality Date   ABDOMINAL HYSTERECTOMY     VULVECTOMY     Social History   Occupational History   Occupation: retired  Tobacco Use   Smoking status: Former    Types: Cigarettes   Smokeless tobacco: Never  Vaping Use   Vaping status: Never Used  Substance and Sexual Activity   Alcohol  use: Yes    Comment: occ   Drug use: No   Sexual activity: Not Currently

## 2024-08-28 LAB — COMPREHENSIVE METABOLIC PANEL WITH GFR
AG Ratio: 1.4 (calc) (ref 1.0–2.5)
ALT: 8 U/L (ref 6–29)
AST: 14 U/L (ref 10–35)
Albumin: 3.7 g/dL (ref 3.6–5.1)
Alkaline phosphatase (APISO): 58 U/L (ref 37–153)
BUN: 13 mg/dL (ref 7–25)
CO2: 29 mmol/L (ref 20–32)
Calcium: 9.2 mg/dL (ref 8.6–10.4)
Chloride: 101 mmol/L (ref 98–110)
Creat: 0.74 mg/dL (ref 0.60–0.95)
Globulin: 2.7 g/dL (ref 1.9–3.7)
Glucose, Bld: 119 mg/dL — ABNORMAL HIGH (ref 65–99)
Potassium: 4.7 mmol/L (ref 3.5–5.3)
Sodium: 137 mmol/L (ref 135–146)
Total Bilirubin: 0.3 mg/dL (ref 0.2–1.2)
Total Protein: 6.4 g/dL (ref 6.1–8.1)
eGFR: 79 mL/min/1.73m2 (ref >=60)

## 2024-08-28 LAB — PARATHYROID HORMONE, INTACT (NO CA): PTH: 35 pg/mL (ref 16–77)

## 2024-08-28 LAB — CBC WITH DIFFERENTIAL/PLATELET
Absolute Lymphocytes: 2034 {cells}/uL (ref 850–3900)
Absolute Monocytes: 595 {cells}/uL (ref 200–950)
Basophils Absolute: 130 {cells}/uL (ref 0–200)
Basophils Relative: 2.1 %
Eosinophils Absolute: 353 {cells}/uL (ref 15–500)
Eosinophils Relative: 5.7 %
HCT: 39.6 % (ref 35.0–45.0)
Hemoglobin: 13.2 g/dL (ref 11.7–15.5)
MCH: 32.5 pg (ref 27.0–33.0)
MCHC: 33.3 g/dL (ref 32.0–36.0)
MCV: 97.5 fL (ref 80.0–100.0)
MPV: 11.5 fL (ref 7.5–12.5)
Monocytes Relative: 9.6 %
Neutro Abs: 3088 {cells}/uL (ref 1500–7800)
Neutrophils Relative %: 49.8 %
Platelets: 234 Thousand/uL (ref 140–400)
RBC: 4.06 Million/uL (ref 3.80–5.10)
RDW: 12.5 % (ref 11.0–15.0)
Total Lymphocyte: 32.8 %
WBC: 6.2 Thousand/uL (ref 3.8–10.8)

## 2024-08-28 LAB — TSH: TSH: 1.52 m[IU]/L (ref 0.40–4.50)

## 2024-08-28 LAB — C-REACTIVE PROTEIN: CRP: <3 mg/L (ref <8.0)

## 2024-10-02 ENCOUNTER — Other Ambulatory Visit: Payer: Self-pay | Admitting: Radiology

## 2024-10-02 DIAGNOSIS — M810 Age-related osteoporosis without current pathological fracture: Secondary | ICD-10-CM

## 2024-10-02 MED ORDER — DENOSUMAB 60 MG/ML ~~LOC~~ SOSY: 60.0000 mg | PREFILLED_SYRINGE | Freq: Once | SUBCUTANEOUS | Status: AC

## 2024-10-06 ENCOUNTER — Ambulatory Visit: Admitting: Psychiatry

## 2024-10-07 DIAGNOSIS — M818 Other osteoporosis without current pathological fracture: Secondary | ICD-10-CM | POA: Diagnosis not present

## 2024-10-07 DIAGNOSIS — H4061X4 Glaucoma secondary to drugs, right eye, indeterminate stage: Secondary | ICD-10-CM | POA: Diagnosis not present

## 2024-10-07 DIAGNOSIS — G3184 Mild cognitive impairment, so stated: Secondary | ICD-10-CM | POA: Diagnosis not present

## 2024-10-07 DIAGNOSIS — S22000D Wedge compression fracture of unspecified thoracic vertebra, subsequent encounter for fracture with routine healing: Secondary | ICD-10-CM | POA: Diagnosis not present

## 2024-10-07 DIAGNOSIS — M8000XA Age-related osteoporosis with current pathological fracture, unspecified site, initial encounter for fracture: Secondary | ICD-10-CM | POA: Diagnosis not present

## 2024-10-07 DIAGNOSIS — L570 Actinic keratosis: Secondary | ICD-10-CM | POA: Diagnosis not present

## 2024-10-08 ENCOUNTER — Other Ambulatory Visit (HOSPITAL_COMMUNITY): Payer: Self-pay | Admitting: Internal Medicine

## 2024-10-09 ENCOUNTER — Telehealth: Payer: Self-pay

## 2024-10-09 NOTE — Telephone Encounter (Signed)
 Auth Submission: NO AUTH NEEDED Site of care: Site of care: [facility] Payer: Medicare A/b Medication & CPT/J Code(s) submitted: Reclast (Zolendronic acid) J3489 Diagnosis Code:  Route of submission (phone, fax, portal): portal Phone # Fax # Auth type: Buy/Bill HB Units/visits requested: 5mg  x 1 dose Reference number:  Approval from: 10/09/24 to 12/24/24

## 2024-10-13 ENCOUNTER — Encounter: Payer: Self-pay | Admitting: Psychiatry

## 2024-10-13 ENCOUNTER — Inpatient Hospital Stay: Attending: Psychiatry | Admitting: Psychiatry

## 2024-10-13 VITALS — BP 128/74 | HR 71 | Temp 98.1°F | Resp 19 | Wt 149.8 lb

## 2024-10-13 DIAGNOSIS — Z8544 Personal history of malignant neoplasm of other female genital organs: Secondary | ICD-10-CM | POA: Diagnosis not present

## 2024-10-13 DIAGNOSIS — Z9079 Acquired absence of other genital organ(s): Secondary | ICD-10-CM | POA: Insufficient documentation

## 2024-10-13 DIAGNOSIS — J392 Other diseases of pharynx: Secondary | ICD-10-CM | POA: Diagnosis not present

## 2024-10-13 DIAGNOSIS — C519 Malignant neoplasm of vulva, unspecified: Secondary | ICD-10-CM

## 2024-10-13 NOTE — Patient Instructions (Signed)
 It was a pleasure to see you in clinic today. - Exam normal today - Referral place to ENT doctor (ear/nose/throat) - Return visit planned for 6 months with PET scan prior  Thank you very much for allowing me to provide care for you today.  I appreciate your confidence in choosing our Gynecologic Oncology team at [facility].  If you have any questions about your visit today please call our office or send us  a MyChart message and we will get back to you as soon as possible.

## 2024-10-13 NOTE — Progress Notes (Signed)
 Gynecologic Oncology Return Clinic Visit  Date of Service: 10/13/2024 Referring Provider:  Olegario Messier, MD [address] [place],  [place] [address]  Assessment & Plan: Debra Olsen is a 88 y.o. 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  SCC of the vulva: - NED on exam today.  - Signs symptoms of recurrence reviewed. - Given risk factors that increase her risk for recurrence including positive LVSI, grade 3, and close margin, 61mo PET completed on 10/11/23 and NED. - Repeat PET 03/31/24 NED. - Continue surveillance q23mo > annually in year 3.  - Repeat PET in 1 year (~March/April 2026) than may be able to discontinue. Ordered.  Oropharyngeal lesion: - PET with mild asymmetric left oropharyngeal hypermetabolism with questionable asymmetric soft tissue prominence on CT.  - No overt abnormality on exam, possible hypertrophy of tonsil - Patient did not see her dentist and says she doesn't follow with them anymore. Referral placed to ENT to follow-up PET finding.   RTC 61mo with PET prior.   Hoy Masters, MD Gynecologic Oncology  Medical Decision Making I personally spent  TOTAL 20 minutes face-to-face and non-face-to-face in the care of this patient, which includes all pre, intra, and post visit time on the date of service.   ----------------------- Reason for Visit: Surveillance  Treatment History: Oncology History  Vulvar cancer (HCC)  02/21/2023 Initial Biopsy   Vulvar biopsy: SCC with DOI 4mm   02/21/2023 Initial Diagnosis   Vulvar cancer (HCC)   04/05/2023 Surgery   Radical partial vulvectomy, bilateral inguinofemoral sentinel lymph node evaluation and biopsies   04/05/2023 Cancer Staging   Staging form: Vulva, AJCC V9 - Pathologic stage from 04/05/2023: FIGO Stage  IB (pT1b, pN0(sn), cM0) - Signed by Masters Hoy, MD on 04/26/2023 Histopathologic type: Squamous cell carcinoma, NOS Stage prefix: Initial diagnosis Method of lymph node assessment: Sentinel lymph node biopsy Histologic grade (G): G3 Histologic grading system: 3 grade system Lymph-vascular invasion (LVI): LVI present/identified, NOS P16 overexpression: Negative Differentiated vulvar intraepithelial neoplasia (dVIN): Present   04/05/2023 Pathology Results   Diagnosis   A. Lymph node, sentinel, right inguinal, excision - One lymph node with no metastatic carcinoma identified by H&E or pancytokeratin AE1/AE3 immunohistochemical stain (0/1)   B. Lymph node, sentinel, left inguinal, excision - One lymph node with no metastatic carcinoma identified by H&E or pancytokeratin AE1/AE3 immunohistochemical stain (0/1)   C. 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  Electronically signed by Aloha Wanda Norris, MD on 04/10/2023 at  9:57 AM  Diagnosis Comment   Immunohistochemical stains are performed on C5 and show that the tumor is negative for p16 with apparent null pattern negative staining for p53, consistent with HPV independent squamous cell carcinoma. CD31 stain on block C8 shows apparent endothelial cells around an area of tumor, supporting the presence of focal lymphovascular space invasion.   Synoptic Report  VULVA VULVA  - All Specimens 9th Edition - Protocol posted: 12/06/2022  SPECIMEN    Procedure:    Partial vulvectomy  TUMOR    Tumor Focality:    Unifocal    Tumor Site:    Anterior midline vulva    Tumor Size:    Greatest Dimension (Centimeters): 0.6 cm    Histologic Type:    Squamous cell carcinoma, HPV-independent    Histologic Grade:    G3, poorly differentiated    Depth of Invasion (Millimeters) (FIGO 2021 method):    2 mm    Tumor Growth Pattern:    Partially pushing but focally infiltrative    Other Tissue / Organ Involvement:    Not applicable    Lymphatic and / or Vascular Invasion:    Present  MARGINS    Margin Status for Invasive Carcinoma:    All margins negative for invasive carcinoma      Closest Margin(s) to Invasive Carcinoma:    Peripheral: 3-6:00      Distance from Invasive Carcinoma to Closest Margin:    3 mm    Margin Status for Precursor Lesions of Squamous Cell Carcinoma and / or Paget Disease:    All margins negative for squamous precursor lesions  REGIONAL LYMPH NODES    Regional Lymph Node Status:          :    All regional lymph nodes negative for tumor cells      Total Number of Lymph Nodes Examined:    2        Nodal Site(s) Examined:    Right inguinal: sentinel        Nodal Site(s) Examined:    Left inguinal: sentinel      Number of Sentinel Nodes Examined:    2  pTNM CLASSIFICATION (AJCC Version 9)    Reporting of pT, pN, and (when applicable) pM categories is based on information available to the pathologist at the time the report is issued. As per the AJCC (Chapter 1, 8th Ed.) it is the managing physician's responsibility to establish the final pathologic stage based upon all pertinent information, including but potentially not limited to this pathology report.    pT Category:    pT1b    pN Category:    pN0    N Suffix:    (sn)  FIGO STAGE    FIGO Stage:    IB  ADDITIONAL FINDINGS    Additional Findings:    Differentiated vulvar intraepithelial neoplasia  (dVIN)  SPECIAL STUDIES    p16 Immunohistochemistry:    Negative    p53 Immunohistochemistry:    Abnormal      :    Absent / null     Comment(s):    See diagnosis comment       Interval History: Overall doing well. Now established with a PCP, ongoing management for her back pain. Otherwise denies pain, vaginal/vulvar itching, bleeding. Denies change in bowel or bladder habits. No weight loss. Denies nausea/vomiting.    Past Medical/Surgical History: Past Medical History:  Diagnosis Date   Arthritis    Cancer (HCC)    Vulvar   Diverticulosis of intestine    GERD (gastroesophageal reflux disease)    Mixed hyperlipidemia    Osteopenia 04/05/2021   Prediabetes    TIA (transient ischemic attack)    Vitamin B12 deficiency     Past Surgical History:  Procedure Laterality Date   ABDOMINAL HYSTERECTOMY     VULVECTOMY  Family History  Problem Relation Age of Onset   Kidney disease Mother    Heart failure Father    Heart disease Father    Breast cancer Sister    Alzheimer's disease Sister    Diabetes Brother    Cancer Brother        unknown   Heart disease Brother    Cancer Other    Ovarian cancer Neg Hx    Colon cancer Neg Hx    Uterine cancer Neg Hx     Social History   Socioeconomic History   Marital status: Widowed    Spouse name: Not on file   Number of children: 5   Years of education: Not on file   Highest education level: Not on file  Occupational History   Occupation: retired  Tobacco Use   Smoking status: Former    Types: Cigarettes   Smokeless tobacco: Never  Vaping Use   Vaping status: Never Used  Substance and Sexual Activity   Alcohol  use: Yes    Comment: occ   Drug use: No   Sexual activity: Not Currently  Other Topics Concern   Not on file  Social History Narrative   Visits with daughter, Ellouise a lot   Other children live out of state   Daughter in [place]  is a Librarian, academic and their POA   Social Drivers of Health   Financial  Resource Strain: Low Risk  (02/12/2023)   Overall Financial Resource Strain (CARDIA)    Difficulty of Paying Living Expenses: Not hard at all  Food Insecurity: No Food Insecurity (03/01/2023)   Hunger Vital Sign    Worried About Running Out of Food in the Last Year: Never true    Ran Out of Food in the Last Year: Never true  Transportation Needs: No Transportation Needs (04/09/2023)   PRAPARE - Administrator, Civil Service (Medical): No    Lack of Transportation (Non-Medical): No  Physical Activity: Insufficiently Active (02/12/2023)   Exercise Vital Sign    Days of Exercise per Week: 3 days    Minutes of Exercise per Session: 30 min  Stress: No Stress Concern Present (02/12/2023)   [employer] of Occupational Health - Occupational Stress Questionnaire    Feeling of Stress : Not at all  Social Connections: Socially Isolated (02/12/2023)   Social Connection and Isolation Panel    Frequency of Communication with Friends and Family: More than three times a week    Frequency of Social Gatherings with Friends and Family: More than three times a week    Attends Religious Services: Never    Database administrator or Organizations: No    Attends Banker Meetings: Never    Marital Status: Widowed    Current Medications:  Current Outpatient Medications:    aspirin  325 MG tablet, Take 325 mg by mouth every 6 (six) hours as needed., Disp: , Rfl:    Biotin 5000 MCG TABS, Take by mouth., Disp: , Rfl:    cholecalciferol (VITAMIN D3) 25 MCG (1000 UNIT) tablet, Take 1,000 Units by mouth daily., Disp: , Rfl:    cyanocobalamin  (VITAMIN B12) 1000 MCG tablet, Take 1,000 mcg by mouth daily., Disp: , Rfl:    fish oil-omega-3 fatty acids 1000 MG capsule, Take 2 g by mouth daily., Disp: , Rfl:    latanoprost (XALATAN) 0.005 % ophthalmic solution, 1 drop at bedtime., Disp: , Rfl:    MAGNESIUM PO, Take 1 tablet by mouth daily., Disp: ,  Rfl:    Multiple Vitamin (MULTIVITAMIN)  tablet, Take 1 tablet by mouth daily., Disp: , Rfl:    omeprazole (PRILOSEC) 10 MG capsule, Take 10 mg by mouth daily., Disp: , Rfl:    OVER THE COUNTER MEDICATION, , Disp: , Rfl:    Specialty Vitamins Products (MEMORY COMPLEX BRAIN HEALTH) TABS, Take by mouth., Disp: , Rfl:    timolol (TIMOPTIC) 0.5 % ophthalmic solution, 1 drop every morning., Disp: , Rfl:    Turmeric (QC TUMERIC COMPLEX) 500 MG CAPS, Take by mouth., Disp: , Rfl:   Current Facility-Administered Medications:    [START ON 10/16/2024] denosumab (PROLIA) injection 60 mg, 60 mg, Subcutaneous, Once, Persons, Ronal Dragon, PA  Review of Symptoms: Complete 10-system review is positive for: Constipation, back pain, fatigue  Physical Exam: BP (!) 141/73 (BP Location: Left Arm, Patient Position: Sitting)   Pulse 71   Temp 98.1 F (36.7 C) (Oral)   Resp 19   Wt 149 lb 12.8 oz (67.9 kg)   SpO2 100%   BMI 22.78 kg/m  General: Alert, oriented, no acute distress. HEENT: Normocephalic, atraumatic. Neck symmetric without masses. Sclera anicteric.  Chest: Normal work of breathing. Clear to auscultation bilaterally.   Cardiovascular: Regular rate and rhythm, no murmurs. Abdomen: Soft, nontender.  Normoactive bowel sounds.  No masses appreciated.   Extremities: Grossly normal range of motion.  Warm, well perfused.  No edema bilaterally. Skin: No rashes or lesions noted. Lymphatics: No cervical, supraclavicular, or inguinal adenopathy. GU: External genitalia with evidence of prior anterior vulvectomy and groin biopsy. Well healed scar. No lesions. Speculum exam with vagina without lesions. Bimanual exam reveals smooth vaginal cuff, no nodularity or pelvic mass. Exam chaperoned by Kimberly Swaziland, CMA     Laboratory & Radiologic Studies: None

## 2024-10-16 ENCOUNTER — Encounter: Attending: Internal Medicine | Admitting: Emergency Medicine

## 2024-10-16 VITALS — BP 146/70 | HR 62 | Temp 97.7°F | Resp 15

## 2024-10-16 DIAGNOSIS — M810 Age-related osteoporosis without current pathological fracture: Secondary | ICD-10-CM | POA: Insufficient documentation

## 2024-10-16 MED ORDER — DIPHENHYDRAMINE HCL 25 MG PO CAPS
25.0000 mg | ORAL_CAPSULE | Freq: Once | ORAL | Status: AC
  Administered 2024-10-16: 25 mg via ORAL

## 2024-10-16 MED ORDER — ACETAMINOPHEN 325 MG PO TABS
650.0000 mg | ORAL_TABLET | Freq: Once | ORAL | Status: AC
  Administered 2024-10-16: 650 mg via ORAL

## 2024-10-16 MED ORDER — ZOLEDRONIC ACID 5 MG/100ML IV SOLN
5.0000 mg | Freq: Once | INTRAVENOUS | Status: AC
  Administered 2024-10-16: 5 mg via INTRAVENOUS

## 2024-10-16 NOTE — Progress Notes (Signed)
 Diagnosis: Osteoporosis  Provider:  Dr Orpha   Procedure: IV Infusion  IV Type: Peripheral, IV Location: L Antecubital  Reclast (Zolendronic Acid), Dose: 5 mg  Infusion Start Time: 1005  Infusion Stop Time: 1035  Post Infusion IV Care: Observation period completed and Peripheral IV Discontinued  Discharge: Condition: Good, Destination: Home . AVS Provided  Performed by:  Delon ONEIDA Officer, RN

## 2024-10-29 ENCOUNTER — Institutional Professional Consult (permissible substitution) (INDEPENDENT_AMBULATORY_CARE_PROVIDER_SITE_OTHER)

## 2024-10-30 ENCOUNTER — Telehealth: Payer: Self-pay

## 2024-10-30 NOTE — Telephone Encounter (Signed)
 Recent referral sent to [facility] ENT specialist by Dr.Newton.   I reached out to the office and spoke to [employer]. Pt is scheduled with Dr.Byers on Tuesday 11/18 @ 1:40.  Pt is aware to date/time. Address and phone number given to patient since she does not have MyChart.

## 2024-11-11 ENCOUNTER — Institutional Professional Consult (permissible substitution) (INDEPENDENT_AMBULATORY_CARE_PROVIDER_SITE_OTHER): Admitting: Otolaryngology

## 2024-11-18 ENCOUNTER — Telehealth: Payer: Self-pay | Admitting: Oncology

## 2024-11-18 NOTE — Telephone Encounter (Signed)
 Called Don back and notified her of appointment with Dr. Roark on 12/02/2024 at 10:20.  She verbalized understanding and agreement.

## 2024-11-18 NOTE — Telephone Encounter (Signed)
 Debra Olsen regarding her canceled appointment with Dr. Roark with ENT.  Advised her that Dr. Eldonna would like her to reschedule due to the abnormal finding on her PET scan (mild asymmetric left oropharyngeal hypermetabolism).  Avangelina said she would like us  to call and get her rescheduled.  She is available any day but Thursdays.

## 2024-12-02 ENCOUNTER — Telehealth: Payer: Self-pay | Admitting: Oncology

## 2024-12-02 ENCOUNTER — Institutional Professional Consult (permissible substitution) (INDEPENDENT_AMBULATORY_CARE_PROVIDER_SITE_OTHER): Admitting: Otolaryngology
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
## 2025-04-09 ENCOUNTER — Other Ambulatory Visit (HOSPITAL_COMMUNITY)

## 2025-04-13 ENCOUNTER — Inpatient Hospital Stay: Admitting: Psychiatry

## 2025-10-19 ENCOUNTER — Ambulatory Visit
